# Patient Record
Sex: Male | Born: 1975 | Race: Black or African American | Hispanic: No | Marital: Single | Smoking: Current every day smoker
Health system: Southern US, Community
[De-identification: ages and names within clinical notes are randomized; demographics above are authoritative.]

## PROBLEM LIST (undated history)

## (undated) DIAGNOSIS — I7300 Raynaud's syndrome without gangrene: Secondary | ICD-10-CM

## (undated) DIAGNOSIS — M1990 Unspecified osteoarthritis, unspecified site: Secondary | ICD-10-CM

## (undated) DIAGNOSIS — T7840XA Allergy, unspecified, initial encounter: Secondary | ICD-10-CM

## (undated) DIAGNOSIS — R011 Cardiac murmur, unspecified: Secondary | ICD-10-CM

## (undated) DIAGNOSIS — R21 Rash and other nonspecific skin eruption: Secondary | ICD-10-CM

## (undated) HISTORY — PX: TONSILECTOMY, ADENOIDECTOMY, BILATERAL MYRINGOTOMY AND TUBES: SHX2538

## (undated) HISTORY — DX: Cardiac murmur, unspecified: R01.1

## (undated) HISTORY — PX: TONSILLECTOMY: SUR1361

## (undated) HISTORY — DX: Rash and other nonspecific skin eruption: R21

## (undated) HISTORY — DX: Allergy, unspecified, initial encounter: T78.40XA

## (undated) HISTORY — DX: Unspecified osteoarthritis, unspecified site: M19.90

---

## 2007-11-29 ENCOUNTER — Emergency Department: Payer: Self-pay | Admitting: Emergency Medicine

## 2008-04-10 ENCOUNTER — Emergency Department: Payer: Self-pay | Admitting: Unknown Physician Specialty

## 2008-11-08 ENCOUNTER — Ambulatory Visit: Payer: Self-pay | Admitting: Family Medicine

## 2016-03-28 ENCOUNTER — Emergency Department
Admission: EM | Admit: 2016-03-28 | Discharge: 2016-03-28 | Disposition: A | Discharge status: Home / Self Care | Payer: Managed Care, Other (non HMO) | Attending: Emergency Medicine | Admitting: Emergency Medicine

## 2016-03-28 ENCOUNTER — Emergency Department: Payer: Managed Care, Other (non HMO)

## 2016-03-28 ENCOUNTER — Encounter: Payer: Self-pay | Admitting: *Deleted

## 2016-03-28 DIAGNOSIS — R42 Dizziness and giddiness: Secondary | ICD-10-CM | POA: Diagnosis present

## 2016-03-28 DIAGNOSIS — F17200 Nicotine dependence, unspecified, uncomplicated: Secondary | ICD-10-CM | POA: Diagnosis not present

## 2016-03-28 LAB — URINALYSIS COMPLETE WITH MICROSCOPIC (ARMC ONLY)
Bacteria, UA: NONE SEEN
Bilirubin Urine: NEGATIVE
Glucose, UA: NEGATIVE mg/dL
HGB URINE DIPSTICK: NEGATIVE
LEUKOCYTES UA: NEGATIVE
NITRITE: NEGATIVE
PROTEIN: NEGATIVE mg/dL
SPECIFIC GRAVITY, URINE: 1.016 (ref 1.005–1.030)
pH: 5 (ref 5.0–8.0)

## 2016-03-28 LAB — CBC
HEMATOCRIT: 46.6 % (ref 40.0–52.0)
HEMOGLOBIN: 15.5 g/dL (ref 13.0–18.0)
MCH: 30.1 pg (ref 26.0–34.0)
MCHC: 33.3 g/dL (ref 32.0–36.0)
MCV: 90.4 fL (ref 80.0–100.0)
Platelets: 205 10*3/uL (ref 150–440)
RBC: 5.16 MIL/uL (ref 4.40–5.90)
RDW: 14 % (ref 11.5–14.5)
WBC: 6.5 10*3/uL (ref 3.8–10.6)

## 2016-03-28 LAB — BASIC METABOLIC PANEL
ANION GAP: 6 (ref 5–15)
BUN: 13 mg/dL (ref 6–20)
CALCIUM: 9.2 mg/dL (ref 8.9–10.3)
CO2: 28 mmol/L (ref 22–32)
Chloride: 107 mmol/L (ref 101–111)
Creatinine, Ser: 1.02 mg/dL (ref 0.61–1.24)
Glucose, Bld: 87 mg/dL (ref 65–99)
POTASSIUM: 3.7 mmol/L (ref 3.5–5.1)
SODIUM: 141 mmol/L (ref 135–145)

## 2016-03-28 LAB — TROPONIN I

## 2016-03-28 MED ORDER — MECLIZINE HCL 25 MG PO TABS
50.0000 mg | ORAL_TABLET | Freq: Once | ORAL | Status: AC
  Administered 2016-03-28: 50 mg via ORAL
  Filled 2016-03-28: qty 2

## 2016-03-28 MED ORDER — MECLIZINE HCL 25 MG PO TABS: 25.0000 mg | ORAL_TABLET | Freq: Three times a day (TID) | ORAL | 0 refills | Status: DC | PRN

## 2016-03-28 NOTE — ED Notes (Signed)
Pt stating that it "feels like I'm spinning." Pt stating that his dizziness is increased with looking up or down. Pt stating that his vision is blurry, which is new for the pt. Pt stating that he has had neck pain for months and has seen a chiropractor. Pt stating some right sided CP . Pt stating that he has had N/V also. Last emesis was about 30 minutes. Pt stating that he believes the nausea is from the dizziness. Pt in NAD at this time.

## 2016-03-28 NOTE — ED Provider Notes (Signed)
[facility] Emergency Department Provider Note  Time seen: 10:22 PM  I have reviewed the triage vital signs and the nursing notes.   HISTORY  Chief Complaint Dizziness    HPI Rodney Higgins is a 40 y.o. male with no past medical history who presents the emergency department for dizziness. According to the patient since around 6:00 this morning he has been intermittently feeling very dizzy. He states his symptoms are worse when he is up walking around. He describes the dizziness as feeling very off balance and lightheaded. Denies any spinning sensation. He states his symptoms are worse when he moves his head. Denies any recent illness. He does state for the past one month he has had somewhat of a cough and for the past several days he has had intermittent chest pain that he relates this to the cough. Denies any focal weakness or numbness. Denies any confusion or slurred speech. Denies any symptoms of dizziness currently.  No past medical history on file.  There are no active problems to display for this patient.   No past surgical history on file.  Prior to Admission medications   Not on File    Allergies  Allergen Reactions  . Penicillins Hives    No family history on file.  Social History Social History  Substance Use Topics  . Smoking status: Current Every Day Smoker  . Smokeless tobacco: Never Used  . Alcohol use No    Review of Systems Constitutional: Negative for fever. Cardiovascular: Negative for chest pain. Respiratory: Negative for shortness of breath. Gastrointestinal: Negative for abdominal pain Genitourinary: Negative for dysuria. Neurological: Negative for headache 10-point ROS otherwise negative.  ____________________________________________   PHYSICAL EXAM:  VITAL SIGNS: ED Triage Vitals  Enc Vitals Group     BP 03/28/16 1917 115/69     Pulse Rate 03/28/16 1917 64     Resp 03/28/16 1917 20     Temp 03/28/16 1917  98.1 F (36.7 C)     Temp Source 03/28/16 1917 Oral     SpO2 03/28/16 1917 99 %     Weight 03/28/16 1918 155 lb (70.3 kg)     Height 03/28/16 1918 5\' 7"  (1.702 m)     Head Circumference --      Peak Flow --      Pain Score --      Pain Loc --      Pain Edu? --      Excl. in GC? --     Constitutional: Alert and oriented. Well appearing and in no distress. Eyes: Normal exam ENT   Head: Normocephalic and atraumatic   Mouth/Throat: Mucous membranes are moist. Cardiovascular: Normal rate, regular rhythm. No murmur Respiratory: Normal respiratory effort without tachypnea nor retractions. Breath sounds are clear  Gastrointestinal: Soft and nontender. No distention. Musculoskeletal: Nontender with normal range of motion in all extremities.  Neurologic:  Normal speech and language. No gross focal neurologic deficits. Equal grip strengths. 5/5 motor in all extremities. No pronator drift. Cranial nerves intact. Skin:  Skin is warm, dry and intact.  Psychiatric: Mood and affect are normal.   ____________________________________________    EKG  EKG reviewed and interpreted by myself shows normal sinus rhythm at 65 bpm, narrow QRS, normal axis, normal intervals, no concerning ST changes. Overall normal/reassuring EKG.  ____________________________________________    RADIOLOGY  CT head is negative  ____________________________________________   INITIAL IMPRESSION / ASSESSMENT AND PLAN / ED COURSE  Pertinent labs & imaging results  that were available during my care of the patient were reviewed by me and considered in my medical decision making (see chart for details).  The patient presents the emergency department with intermittent dizziness throughout the day today. Patient has a normal neurologic exam. Normal physical exam. Overall appears very well. Patient's lab work is largely within normal limits including negative troponin. We will dose meclizine and proceed with a CT  scan of the patient's head to further evaluate. The patient is agreeable to this plan.  CT head is negative. Patient's labs within normal limits. Patient states he is feeling much better after meclizine. We'll discharge the meclizine and PCP follow-up.  ____________________________________________   FINAL CLINICAL IMPRESSION(S) / ED DIAGNOSES  Dizziness    Minna AntisKevin Kavish Lafitte, MD 03/28/16 [phone]

## 2016-03-28 NOTE — ED Triage Notes (Addendum)
Pt to triage via wheelchair.  Pt states he woke up dizzy this morning.  Pt states he has neck pain.  Intermittent visual problems.  Pt reports nausea.  Pt alert.  Speech clear.  Pt denies headache.  Pt states room is spinning at times.  No hx vertigo.

## 2018-02-11 ENCOUNTER — Other Ambulatory Visit: Payer: Self-pay

## 2018-02-11 ENCOUNTER — Ambulatory Visit: Payer: Commercial Managed Care - PPO | Admitting: Urology

## 2018-02-11 ENCOUNTER — Encounter: Payer: Self-pay | Admitting: Urology

## 2018-02-11 VITALS — BP 128/80 | HR 88 | Ht 68.0 in | Wt 161.4 lb

## 2018-02-11 DIAGNOSIS — Z8042 Family history of malignant neoplasm of prostate: Secondary | ICD-10-CM

## 2018-02-11 DIAGNOSIS — Z3009 Encounter for other general counseling and advice on contraception: Secondary | ICD-10-CM

## 2018-02-11 MED ORDER — DIAZEPAM 10 MG PO TABS: 10.0000 mg | ORAL_TABLET | Freq: Once | ORAL | 0 refills | Status: AC

## 2018-02-11 NOTE — Patient Instructions (Signed)
Vasectomy  A vasectomy is tying (with or without cutting) the tube that collects the sperm from the testicle (vas deferens). The vasectomy blocks the sperm from going through the vas deferens and penis so that during sexual intercourse, the sperm does not go into the vagina. Vasectomy is safe, with very rare complications. It does not affect your sexual desire or performance. A vasectomy does not prevent sexually transmitted diseases.  Because vasectomy is considered permanent, you should not have it done until you are sure you do not want any more children. You and your partner should be in full agreement to have the procedure. Your decision to have a vasectomy should not be made during a stressful situation. This includes loss of a pregnancy, illness, death of a spouse, or divorce. There are other means of contraception that can be used until you are completely sure you want this procedure done.  Tell a health care provider about:  · Any allergies you have.  · All medicines you are taking, including vitamins, herbs, eye drops, creams, and over-the-counter medicines.  · Any problems you or family members have had with anesthetic medicines.  · Any blood disorders you have.  · Any surgeries you have had.  · Any medical conditions you have.  What are the risks?  Generally, vasectomy is a safe procedure. However, as with any procedure, complications can occur. Possible complications include:  · Failure of the procedure to cause infertility. This means you would still be able to get a male pregnant. Even after sterilization has been achieved, there is a 1 in 10,000 chance that the two cut ends may reconnect (recanalization).  · Infection. A germ starts growing in the wound. This can usually be treated with antibiotic medicine(s).  · An allergic reaction to the anesthetic or other medicine given.  · Bleeding. Blood may seep under the skin so that the scrotum and penis  appear to be bruised. Sometimes the scrotum can swell and get the size of a grapefruit. This usually disappears without treatment within a week or two.    What happens before the procedure?  · Do not take aspirin or aspirin-containing products for 7 days prior to your procedure.  · Do not take nonsteroidal anti-inflammatory products for 7 days prior to your procedure.  · You may be instructed to wash with soap before coming in for your procedure.  What happens during the procedure?  · The scrotum is cleaned with bacteria-killing soap, and the health care provider finds the vas deferens.  · Each side of the scrotum is numbed.  · A very small cut (incision) is made, and the vas deferens are pulled out of the scrotum. The vas deferens are then tied off, cut, or may be burned (cauterized) at the ends.  · Sometimes the vas deferens are pulled out from the scrotum through a puncture wound. This is done with a special instrument without an incision.  · The vas deferens are then put back into the scrotum, and the incision or puncture wound is closed. Absorbable suture material that will dissolve and not need to be removed is commonly used.  · After surgery, sperm may still be left in the vas deferens for 1–3 months. Because of this, other means of contraception should be used until your health care provider examines you and finds there are no sperm in your seminal fluid.  What happens after the procedure?  After the procedure, you will be taken to the recovery area. Your   progress will be watched and checked. Once you are awake, stable, and taking fluids well, you will be allowed to go home as long as there are no problems.  This information is not intended to replace advice given to you by your health care provider. Make sure you discuss any questions you have with your health care provider.  Document Released: 09/07/2002 Document Revised: 11/23/2015 Document Reviewed: 01/04/2013   Elsevier Interactive Patient Education © 2018 [employer]

## 2018-02-11 NOTE — Progress Notes (Signed)
   02/11/2018 10:04 AM   Rodney Higgins Nov 27, 1975 [id number]  Referring provider: [facility] [address] [place], [place] [address]  CC: Desire for sterilization  HPI: I am seeing Rodney Higgins in urology clinic today for vasectomy consultation.  He is a healthy 42 year old man who is married with one 43 year old girl.  Both he and his wife do not desire any further children.  His wife did have an ectopic pregnancy health scare recently and they would like to pursue surgical sterilization.  He denies any prior urologic problems.  They are currently using the pullout method for contraception.  He does report a history of lethal prostate cancer in his family.   PMH: History reviewed. No pertinent past medical history.  Surgical History: History reviewed. No pertinent surgical history.  Allergies:  Allergies  Allergen Reactions  . Penicillins Hives    Family History: Family History  Problem Relation Age of Onset  . Lung cancer Father 365  . Prostate cancer Paternal Grandfather     Social History:  reports that he has been smoking. He has never used smokeless tobacco. He reports that he drinks about 2.0 standard drinks of alcohol per week. He reports that he does not use drugs.  ROS: Please see flowsheet from today's date for complete review of systems.  Physical Exam: BP 128/80   Pulse 88   Ht 5\' 8"  (1.727 m)   Wt 161 lb 6.4 oz (73.2 kg)   BMI 24.54 kg/m    Constitutional:  Alert and oriented, No acute distress. Cardiovascular: No clubbing, cyanosis, or edema. Respiratory: Normal respiratory effort, no increased work of breathing. GI: Abdomen is soft, nontender, nondistended, no abdominal masses GU: No CVA tenderness, circumcised phallus without lesions, widely patent meatus, thick scrotum, however vas are palpable bilaterally Lymph: No cervical or inguinal lymphadenopathy. Skin: No rashes, bruises or suspicious  lesions. Neurologic: Grossly intact, no focal deficits, moving all 4 extremities. Psychiatric: Normal mood and affect.  Laboratory Data: None  Pertinent Imaging: None to review  Assessment & Plan:   In summary, Rodney Higgins is a healthy 42 year old male with a family history of lethal prostate cancer here for vasectomy evaluation.  We had a long discussion about surgical sterilization with vasectomy today.  We discussed the risks and benefits of the procedure in detail.  We specifically discussed that it should be considered a permanent form of sterilization, and alternatives for pregnancy in the future (vas reversal and IVF/ICSI) are both expensive and not guaranteed to work.  We discussed the 1 to 2% risk of bleeding, infection, and chronic testicular pain.  We discussed the 07/1998 failure rate when sterility confirmed with a negative 8 to 16-week postvasectomy semen analysis, and the important of continuing contraception until that time.    -We will schedule for vasectomy next available pending insurance approval -Start PSA screening  Sondra ComeBrian C Naaman Curro, MD  [facility] [address], [address] [place], [place] [address] [phone]

## 2018-02-20 ENCOUNTER — Encounter: Payer: Commercial Managed Care - PPO | Admitting: Urology

## 2018-03-27 ENCOUNTER — Ambulatory Visit: Payer: Commercial Managed Care - PPO | Admitting: Urology

## 2018-03-27 ENCOUNTER — Encounter: Payer: Self-pay | Admitting: Urology

## 2018-03-27 VITALS — BP 101/69 | HR 82 | Resp 16 | Ht 68.0 in | Wt 166.4 lb

## 2018-03-27 DIAGNOSIS — Z302 Encounter for sterilization: Secondary | ICD-10-CM | POA: Diagnosis not present

## 2018-03-27 NOTE — Progress Notes (Signed)
VASECTOMY PROCEDURE NOTE:  The patient was taken to the minor procedure room and placed in the supine position. His genitals were prepped and draped in the usual sterile fashion. The left vas deferens was brought up to the skin of the left upper scrotum. The skin overlying it was anesthetized with 1% lidocaine without epinephrine, anesthetic was also injected proximally alongside the vas deferens. The no scalpel vasectomy instrument was used to make a small perforation in the scrotal skin. The vasectomy clamp was used to grasp the vas deferens. It was carefully dissected free from surrounding structures. A 1cm segment of the vas was removed, and the cut ends of the mucosa were cauterized. A 4-0 chromic was used to turn back the proximal end of the vas. No significant bleeding was noted. The vas deferens was returned to the scrotum. The skin incision was closed with a simple interrupted stitch of 4-0 chromic.  Attention was then turned to the right side. The right vasectomy was performed in the same exact fashion. Sterile dressings were placed over each incision. The patient tolerated the procedure well.  IMPRESSION/DIAGNOSIS: The patient is a 42 year old gentleman who underwent a vasectomy today. Post-procedure instructions were reviewed. I stressed the importance of continuing to use birth control until he provides a semen specimen more than 2 months from now that demonstrates azoospermia.  Counseled regarding return precautions including fever over 101, significant bleeding or hematoma, or uncontrolled pain. Stressed the importance of avoiding strenuous activity for one week, no sexual activity for 5 days, intermittent icing over the next 48 hours, and scrotal support.   PLAN: The patient will be advised of his semen analysis results when available.  Legrand Rams, MD 03/27/2018

## 2018-06-18 ENCOUNTER — Other Ambulatory Visit: Payer: Self-pay

## 2018-06-18 DIAGNOSIS — Z302 Encounter for sterilization: Secondary | ICD-10-CM

## 2018-06-19 ENCOUNTER — Other Ambulatory Visit: Payer: Commercial Managed Care - PPO

## 2018-06-25 ENCOUNTER — Other Ambulatory Visit: Payer: Commercial Managed Care - PPO

## 2018-06-25 ENCOUNTER — Other Ambulatory Visit: Payer: Self-pay

## 2018-06-25 DIAGNOSIS — Z9852 Vasectomy status: Secondary | ICD-10-CM

## 2018-06-25 NOTE — Progress Notes (Signed)
Patient dropped off semen post vas check today but the sample provided was not enough volume to be sent out for analysis. Patient was contacted and instructed to recollect, he will stop by the office to pick up another cup and repeat drop off

## 2019-06-14 ENCOUNTER — Other Ambulatory Visit: Payer: Self-pay | Admitting: Urology

## 2019-06-14 DIAGNOSIS — Z302 Encounter for sterilization: Secondary | ICD-10-CM

## 2019-09-15 ENCOUNTER — Ambulatory Visit: Payer: Commercial Managed Care - PPO | Admitting: Nurse Practitioner

## 2019-10-19 ENCOUNTER — Other Ambulatory Visit: Payer: Self-pay

## 2019-10-19 ENCOUNTER — Ambulatory Visit: Payer: Commercial Managed Care - PPO | Admitting: Nurse Practitioner

## 2019-10-19 ENCOUNTER — Encounter: Payer: Self-pay | Admitting: Nurse Practitioner

## 2019-10-19 VITALS — BP 123/83 | HR 83 | Temp 98.2°F | Ht 67.0 in | Wt 165.0 lb

## 2019-10-19 DIAGNOSIS — Z7689 Persons encountering health services in other specified circumstances: Secondary | ICD-10-CM | POA: Diagnosis not present

## 2019-10-19 DIAGNOSIS — B029 Zoster without complications: Secondary | ICD-10-CM | POA: Diagnosis not present

## 2019-10-19 DIAGNOSIS — R011 Cardiac murmur, unspecified: Secondary | ICD-10-CM | POA: Diagnosis not present

## 2019-10-19 DIAGNOSIS — I7300 Raynaud's syndrome without gangrene: Secondary | ICD-10-CM | POA: Diagnosis not present

## 2019-10-19 DIAGNOSIS — F17210 Nicotine dependence, cigarettes, uncomplicated: Secondary | ICD-10-CM

## 2019-10-19 MED ORDER — VALACYCLOVIR HCL 1 G PO TABS: 1000.0000 mg | ORAL_TABLET | Freq: Three times a day (TID) | ORAL | 0 refills | Status: AC

## 2019-10-19 MED ORDER — AMLODIPINE BESYLATE 5 MG PO TABS: 5.0000 mg | ORAL_TABLET | Freq: Every day | ORAL | 3 refills | Status: DC

## 2019-10-19 NOTE — Patient Instructions (Signed)
Raynaud Phenomenon ° °Raynaud phenomenon is a condition that affects the blood vessels (arteries) that carry blood to your fingers and toes. The arteries that supply blood to your ears, lips, nipples, or the tip of your nose might also be affected. Raynaud phenomenon causes the arteries to become narrow temporarily (spasm). As a result, the flow of blood to the affected areas is temporarily decreased. This usually occurs in response to cold temperatures or stress. During an attack, the skin in the affected areas turns white, then blue, and finally red. You may also feel tingling or numbness in those areas. °Attacks usually last for only a brief period, and then the blood flow to the area returns to normal. In most cases, Raynaud phenomenon does not cause serious health problems. °What are the causes? °In many cases, the cause of this condition is not known. The condition may occur on its own (primary Raynaud phenomenon) or may be associated with other diseases or factors (secondary Raynaud phenomenon). °Possible causes may include: °· Diseases or medical conditions that damage the arteries. °· Injuries and repetitive actions that hurt the hands or feet. °· Being exposed to certain chemicals. °· Taking medicines that narrow the arteries. °· Other medical conditions, such as lupus, scleroderma, rheumatoid arthritis, thyroid problems, blood disorders, Sjogren syndrome, or atherosclerosis. °What increases the risk? °The following factors may make you more likely to develop this condition: °· Being 20-40 years old. °· Being male. °· Having a family history of Raynaud phenomenon. °· Living in a cold climate. °· Smoking. °What are the signs or symptoms? °Symptoms of this condition usually occur when you are exposed to cold temperatures or when you have emotional stress. The symptoms may last for a few minutes or up to several hours. They usually affect your fingers but may also affect your toes, nipples, lips, ears, or  the tip of your nose. Symptoms may include: °· Changes in skin color. The skin in the affected areas will turn pale or white. The skin may then change from white to bluish to red as normal blood flow returns to the area. °· Numbness, tingling, or pain in the affected areas. °In severe cases, symptoms may include: °· Skin sores. °· Tissues decaying and dying (gangrene). °How is this diagnosed? °This condition may be diagnosed based on: °· Your symptoms and medical history. °· A physical exam. During the exam, you may be asked to put your hands in cold water to check for a reaction to cold temperature. °· Tests, such as: °? Blood tests to check for other diseases or conditions. °? A test to check the movement of blood through your arteries and veins (vascular ultrasound). °? A test in which the skin at the base of your fingernail is examined under a microscope (nailfold capillaroscopy). °How is this treated? °Treatment for this condition often involves making lifestyle changes and taking steps to control your exposure to cold temperatures. For more severe cases, medicine (calcium channel blockers) may be used to improve blood flow. Surgery is sometimes done to block the nerves that control the affected arteries, but this is rare. °Follow these instructions at home: °Avoiding cold temperatures °Take these steps to avoid exposure to cold: °· If possible, stay indoors during cold weather. °· When you go outside during cold weather, dress in layers and wear mittens, a hat, a scarf, and warm footwear. °· Wear mittens or gloves when handling ice or frozen food. °· Use holders for glasses or cans containing cold drinks. °·   Let warm water run for a while before taking a shower or bath. °· Warm up the car before driving in cold weather. °Lifestyle ° °· If possible, avoid stressful and emotional situations. Try to find ways to manage your stress, such as: °? Exercise. °? Yoga. °? Meditation. °? Biofeedback. °· Do not use any  products that contain nicotine or tobacco, such as cigarettes and e-cigarettes. If you need help quitting, ask your health care provider. °· Avoid secondhand smoke. °· Limit your use of caffeine. °? Switch to decaffeinated coffee, tea, and soda. °? Avoid chocolate. °· Avoid vibrating tools and machinery. °General instructions °· Protect your hands and feet from injuries, cuts, or bruises. °· Avoid wearing tight rings or wristbands. °· Wear loose fitting socks and comfortable, roomy shoes. °· Take over-the-counter and prescription medicines only as told by your health care provider. °Contact a health care provider if: °· Your discomfort becomes worse despite lifestyle changes. °· You develop sores on your fingers or toes that do not heal. °· Your fingers or toes turn black. °· You have breaks in the skin on your fingers or toes. °· You have a fever. °· You have pain or swelling in your joints. °· You have a rash. °· Your symptoms occur on only one side of your body. °Summary °· Raynaud phenomenon is a condition that affects the arteries that carry blood to your fingers, toes, ears, lips, nipples, or the tip of your nose. °· In many cases, the cause of this condition is not known. °· Symptoms of this condition include changes in skin color, and numbness and tingling of the affected area. °· Treatment for this condition includes lifestyle changes, reducing exposure to cold temperatures, and using medicines for severe cases of the condition. °· Contact your health care provider if your condition worsens despite treatment. °This information is not intended to replace advice given to you by your health care provider. Make sure you discuss any questions you have with your health care provider. °Document Revised: 06/20/2017 Document Reviewed: 07/29/2016 °Elsevier Patient Education © 2020 [employer]. ° °

## 2019-10-19 NOTE — Progress Notes (Signed)
New Patient Office Visit  Subjective:  Patient ID: Rodney Higgins, male    DOB: 11-06-1975  Age: 44 y.o. MRN: [id number]  CC:  Chief Complaint  Patient presents with  . Establish Care  . Rash    back of the head x over two weeks now. went to UC. given cetirizine and kenalog cream. pt stetes meds are helping but not as he would like to.    HPI Rodney Higgins presents for new patient visit to establish care.  Introduced to Designer, jewellery role and practice setting.  All questions answered.  Patient is a smoker, started at age 44 to 53, currently smokes 3-4 cigarettes a day.  He reports trying to quit twice a week and then goes to work and gets irritated, starts back.  At this time wishes to continues quitting attempts on his own.  RASH Presented over 2 weeks ago, reports UC visit and was treated for shingles with Valtrex and cream.  Reports he does shave his head.  Area did initially improve and is not as bad as it was, but reports ongoing rash to back of scalp which he felt was agitated from shaving head.  Continues to use Triamcinolone cream to area which helps.  No further pain reported, this has improved. Duration:  days  Location: posterior scalp  Itching: no Burning: no Redness: yes Oozing: no Scaling: no Blisters: no Painful: no Fevers: no Change in detergents/soaps/personal care products: no Recent illness: no Recent travel:no History of same: yes Context: fluctuating Alleviating factors: Triamcinolone Treatments attempted:Triamcinolone Shortness of breath: no  Throat/tongue swelling: no Myalgias/arthralgias: no   HAND PAIN: Has been going on 2-3 years, was assessed by previous clinic who told him it was "just inflammation" and arthritis.  He reports they get white like a shirt and cold, happens once to twice a week.  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  Is able to move and function, no  decrease ROM.  Has more flares when cold outside then when warm.  Reports pain is like a burning when his fingers and top palm turn white, but this goes away over time (15 minutes).  Is not constant. Duration: years Involved hand: bilateral Mechanism of injury: none Location: diffuse Onset: gradual Severity: mild  Quality: burning Frequency: intermittent Radiation: no Aggravating factors: cold temps Alleviating factors: warmth Treatments attempted: none Relief with NSAIDs?: No NSAIDs Taken Weakness: no Numbness: no Redness: no Swelling:no Bruising: no Fevers: no  Past Medical History:  Diagnosis Date  . Allergy   . Arthritis   . Heart murmur    diagnosed in 5th to 6th grade -- no echo -- has had EKG  . Rash     Past Surgical History:  Procedure Laterality Date  . TONSILECTOMY, ADENOIDECTOMY, BILATERAL MYRINGOTOMY AND TUBES      Family History  Problem Relation Age of Onset  . Hypertension Mother   . Lung cancer Father 13  . Prostate cancer Paternal Grandfather   . Heart attack Paternal Grandfather   . Diabetes Maternal Uncle   . Cancer Paternal Uncle   . Stroke Maternal Grandmother   . Heart attack Maternal Grandmother   . Cancer Maternal Grandfather     Social History   Socioeconomic History  . Marital status: Single    Spouse name: Not on file  . Number of children: Not on file  . Years of  education: Not on file  . Highest education level: Not on file  Occupational History  . Not on file  Tobacco Use  . Smoking status: Current Every Day Smoker    Packs/day: 0.25    Types: Cigarettes  . Smokeless tobacco: Never Used  Substance and Sexual Activity  . Alcohol use: Yes    Alcohol/week: 11.0 standard drinks    Types: 2 Glasses of wine, 6 Cans of beer, 3 Shots of liquor per week    Comment: does not drink Mon through Friday -- only weekends  . Drug use: Not Currently    Types: Marijuana  . Sexual activity: Yes  Other Topics Concern  . Not on file    Social History Narrative  . Not on file   Social Determinants of Health   Financial Resource Strain: Low Risk   . Difficulty of Paying Living Expenses: Not hard at all  Food Insecurity: No Food Insecurity  . Worried About Programme researcher, broadcasting/film/video in the Last Year: Never true  . Ran Out of Food in the Last Year: Never true  Transportation Needs: No Transportation Needs  . Lack of Transportation (Medical): No  . Lack of Transportation (Non-Medical): No  Physical Activity: Insufficiently Active  . Days of Exercise per Week: 3 days  . Minutes of Exercise per Session: 30 min  Stress: No Stress Concern Present  . Feeling of Stress : Only a little  Social Connections: Unknown  . Frequency of Communication with Friends and Family: Three times a week  . Frequency of Social Gatherings with Friends and Family: Three times a week  . Attends Religious Services: Never  . Active Member of Clubs or Organizations: No  . Attends Banker Meetings: Never  . Marital Status: Not on file  Intimate Partner Violence:   . Fear of Current or Ex-Partner:   . Emotionally Abused:   Marland Kitchen Physically Abused:   . Sexually Abused:     ROS Review of Systems  Constitutional: Negative for activity change, diaphoresis, fatigue and fever.  Respiratory: Negative for cough, chest tightness, shortness of breath and wheezing.   Cardiovascular: Negative for chest pain, palpitations and leg swelling.  Gastrointestinal: Negative.   Musculoskeletal: Positive for arthralgias.  Skin: Positive for rash.  Neurological: Negative.   Psychiatric/Behavioral: Negative.     Objective:   Today's Vitals: BP 123/83   Pulse 83   Temp 98.2 F (36.8 C) (Oral)   Ht 5\' 7"  (1.702 m)   Wt 165 lb (74.8 kg)   SpO2 98%   BMI 25.84 kg/m   Physical Exam Vitals and nursing note reviewed.  Constitutional:      General: He is awake. He is not in acute distress.    Appearance: He is well-developed and well-groomed. He is not  ill-appearing.  HENT:     Head: Normocephalic and atraumatic.     Right Ear: Hearing normal. No drainage.     Left Ear: Hearing normal. No drainage.  Eyes:     General: Lids are normal.        Right eye: No discharge.        Left eye: No discharge.     Conjunctiva/sclera: Conjunctivae normal.     Pupils: Pupils are equal, round, and reactive to light.  Neck:     Thyroid: No thyromegaly.     Vascular: No carotid bruit.  Cardiovascular:     Rate and Rhythm: Normal rate and regular rhythm.     Heart  sounds: S1 normal and S2 normal. Murmur present. Systolic murmur present with a grade of 1/6. No gallop.   Pulmonary:     Effort: Pulmonary effort is normal. No accessory muscle usage or respiratory distress.     Breath sounds: Normal breath sounds.  Abdominal:     General: Bowel sounds are normal.     Palpations: Abdomen is soft.  Musculoskeletal:        General: Normal range of motion.     Right hand: Normal.     Left hand: Normal.     Cervical back: Normal range of motion and neck supple.     Right lower leg: No edema.     Left lower leg: No edema.     Comments: Unable to reproduce discomfort or color change on exam today.  Skin:    General: Skin is warm and dry.     Capillary Refill: Capillary refill takes less than 2 seconds.     Findings: Rash present.       Neurological:     Mental Status: He is alert and oriented to person, place, and time.  Psychiatric:        Attention and Perception: Attention normal.        Mood and Affect: Mood normal.        Speech: Speech normal.        Behavior: Behavior normal. Behavior is cooperative.        Thought Content: Thought content normal.     Assessment & Plan:   Problem List Items Addressed This Visit      Other   Shingles rash    Ongoing rash, crusting present.  Have recommend he avoid shaving scalp until rash has completely healed.  Will repeat Valtrex treatment x 1 -- script sent for 1000 MG TID x 7 days.  Continue to use  Triamcinolone cream as needed for comfort.  Ensure to wash all linens in bed.  Return to office in 4 weeks OR sooner if any worsening of symptoms presents.      Relevant Medications   valACYclovir (VALTREX) 1000 MG tablet   Raynaud phenomenon    Hand pain symptoms highly suspicious for Raynaud.  Have recommended complete smoking cessation.  Will trial Amlodipine 5 MG daily to see if it offers benefit and decreases amount of flares. Educated him on this medication + side effects to monitor for and report.  Avoid cold exposure when possible.  Wear hand warmers if unable to avoid cold exposure and using hands.  Recommend use of warm gloves at work.  Return to office in 4 weeks for physical exam and labs.      Nicotine dependence, cigarettes, uncomplicated    I have recommended complete cessation of tobacco use. I have discussed various options available for assistance with tobacco cessation including over the counter methods (Nicotine gum, patch and lozenges). We also discussed prescription options (Chantix, Nicotine Inhaler / Nasal Spray). The patient is not interested in pursuing any prescription tobacco cessation options at this time.       Heart murmur    Ongoing for years, no previous echo and asymptomatic.  Could consider echo if any symptoms present or worsening murmur.         Other Visit Diagnoses    Encounter to establish care    -  Primary      Outpatient Encounter Medications as of 10/19/2019  Medication Sig  . cetirizine (ZYRTEC) 10 MG tablet Take 10 mg by mouth  daily.  . triamcinolone cream (KENALOG) 0.1 % APPLY A THIN LAYER TO AFFECTED AREAS TWICE DAILY AS NEEDED  . amLODipine (NORVASC) 5 MG tablet Take 1 tablet (5 mg total) by mouth daily.  . valACYclovir (VALTREX) 1000 MG tablet Take 1 tablet (1,000 mg total) by mouth 3 (three) times daily for 7 days.  . [DISCONTINUED] meclizine (ANTIVERT) 25 MG tablet Take 1 tablet (25 mg total) by mouth 3 (three) times daily as needed for  dizziness.  . [DISCONTINUED] meloxicam (MOBIC) 15 MG tablet    No facility-administered encounter medications on file as of 10/19/2019.    Follow-up: Return in about 4 weeks (around 11/16/2019) for Annual physical.   Marjie Skiff, NP

## 2019-10-19 NOTE — Assessment & Plan Note (Signed)
Ongoing rash, crusting present.  Have recommend he avoid shaving scalp until rash has completely healed.  Will repeat Valtrex treatment x 1 -- script sent for 1000 MG TID x 7 days.  Continue to use Triamcinolone cream as needed for comfort.  Ensure to wash all linens in bed.  Return to office in 4 weeks OR sooner if any worsening of symptoms presents.

## 2019-10-19 NOTE — Assessment & Plan Note (Signed)
Ongoing for years, no previous echo and asymptomatic.  Could consider echo if any symptoms present or worsening murmur.   

## 2019-10-19 NOTE — Assessment & Plan Note (Signed)
I have recommended complete cessation of tobacco use. I have discussed various options available for assistance with tobacco cessation including over the counter methods (Nicotine gum, patch and lozenges). We also discussed prescription options (Chantix, Nicotine Inhaler / Nasal Spray). The patient is not interested in pursuing any prescription tobacco cessation options at this time.  

## 2019-10-19 NOTE — Assessment & Plan Note (Addendum)
Hand pain symptoms highly suspicious for Raynaud.  Have recommended complete smoking cessation.  Will trial Amlodipine 5 MG daily to see if it offers benefit and decreases amount of flares. Educated him on this medication + side effects to monitor for and report.  Avoid cold exposure when possible.  Wear hand warmers if unable to avoid cold exposure and using hands.  Recommend use of warm gloves at work.  Return to office in 4 weeks for physical exam and labs.

## 2019-11-16 ENCOUNTER — Ambulatory Visit (INDEPENDENT_AMBULATORY_CARE_PROVIDER_SITE_OTHER): Payer: Commercial Managed Care - PPO | Admitting: Nurse Practitioner

## 2019-11-16 ENCOUNTER — Encounter: Payer: Self-pay | Admitting: Nurse Practitioner

## 2019-11-16 ENCOUNTER — Other Ambulatory Visit: Payer: Self-pay

## 2019-11-16 VITALS — BP 103/70 | HR 85 | Temp 98.4°F | Ht 66.81 in | Wt 162.5 lb

## 2019-11-16 DIAGNOSIS — L03011 Cellulitis of right finger: Secondary | ICD-10-CM

## 2019-11-16 DIAGNOSIS — M1712 Unilateral primary osteoarthritis, left knee: Secondary | ICD-10-CM | POA: Diagnosis not present

## 2019-11-16 DIAGNOSIS — Z0000 Encounter for general adult medical examination without abnormal findings: Secondary | ICD-10-CM | POA: Diagnosis not present

## 2019-11-16 DIAGNOSIS — Z13220 Encounter for screening for lipoid disorders: Secondary | ICD-10-CM

## 2019-11-16 DIAGNOSIS — Z23 Encounter for immunization: Secondary | ICD-10-CM

## 2019-11-16 DIAGNOSIS — Z800 Family history of malignant neoplasm of digestive organs: Secondary | ICD-10-CM

## 2019-11-16 DIAGNOSIS — I7300 Raynaud's syndrome without gangrene: Secondary | ICD-10-CM | POA: Diagnosis not present

## 2019-11-16 DIAGNOSIS — Z8042 Family history of malignant neoplasm of prostate: Secondary | ICD-10-CM

## 2019-11-16 DIAGNOSIS — Z113 Encounter for screening for infections with a predominantly sexual mode of transmission: Secondary | ICD-10-CM

## 2019-11-16 DIAGNOSIS — F17210 Nicotine dependence, cigarettes, uncomplicated: Secondary | ICD-10-CM

## 2019-11-16 DIAGNOSIS — B029 Zoster without complications: Secondary | ICD-10-CM | POA: Diagnosis not present

## 2019-11-16 DIAGNOSIS — L0390 Cellulitis, unspecified: Secondary | ICD-10-CM | POA: Insufficient documentation

## 2019-11-16 DIAGNOSIS — Z1329 Encounter for screening for other suspected endocrine disorder: Secondary | ICD-10-CM | POA: Diagnosis not present

## 2019-11-16 MED ORDER — PREDNISONE 10 MG PO TABS: ORAL_TABLET | ORAL | 0 refills | Status: DC

## 2019-11-16 MED ORDER — VALACYCLOVIR HCL 1 G PO TABS: 1000.0000 mg | ORAL_TABLET | Freq: Every day | ORAL | 4 refills | Status: DC

## 2019-11-16 MED ORDER — DOXYCYCLINE HYCLATE 100 MG PO TABS: 100.0000 mg | ORAL_TABLET | Freq: Two times a day (BID) | ORAL | 0 refills | Status: DC

## 2019-11-16 NOTE — Assessment & Plan Note (Addendum)
Ongoing issue, now present around exterior right mouth.  Will start on daily Valtrex 1000 MG and Prednisone taper script sent.  Recommend continued use of Triamcinolone cream as needed.  Recommend he go to his ophthalmologist for eye exam ASAP due to two recent outbreaks to face, would benefit from eye exam.   If ongoing and minimal improvement with prophylaxis may benefit from dermatology referral and further lab work-up.  Return in 6 weeks for follow-up.

## 2019-11-16 NOTE — Assessment & Plan Note (Signed)
Paternal grandfather with history of prostate CA.  At length discussion with patient on PSA screening, risks/benefits.  Due to family history he would like to start screening at this time, which is appropriate.  PSA ordered today and will plan to check annually.

## 2019-11-16 NOTE — Assessment & Plan Note (Signed)
Maternal grandfather with history of colon cancer.  Discussed at length with patient, he is interested in starting screening now, may be appropriate based on family history and ethnicity + daily smoker.  Has risk factors present.  Will place referral to GI for further consideration.

## 2019-11-16 NOTE — Assessment & Plan Note (Signed)
To right middle finger.  Ongoing since briar removed from area.  Will start Doxycycline 100 MG BID x 7 days and recommend he continue to rest area.  Warm compresses to finger at least 3-4 times a day for comfort.  May take Tylenol as needed for discomfort.  For worsening or ongoing symptoms then return to office ASAP.

## 2019-11-16 NOTE — Assessment & Plan Note (Signed)
I have recommended complete cessation of tobacco use. I have discussed various options available for assistance with tobacco cessation including over the counter methods (Nicotine gum, patch and lozenges). We also discussed prescription options (Chantix, Nicotine Inhaler / Nasal Spray). The patient is not interested in pursuing any prescription tobacco cessation options at this time.  

## 2019-11-16 NOTE — Progress Notes (Signed)
BP 103/70    Pulse 85    Temp 98.4 F (36.9 C)    Ht 5' 6.81" (1.697 m)    Wt 162 lb 8 oz (73.7 kg)    SpO2 97%    BMI 25.60 kg/m    Subjective:    Patient ID: Rodney Higgins, male    DOB: 1976-01-04, 44 y.o.   MRN: [id number]  HPI: Rodney Higgins is a 44 y.o. male presenting on 11/16/2019 for comprehensive medical examination. Current medical complaints include:none  He currently lives with: wife and children Interim Problems from his last visit: no   RAYNAUD PHENOMENON: Started on Amlodipine last visit for symptom management.  Taking every other day, as he felt taking every day was causing rash, but no rash with every other day and is tolerating well.  Reports he has a recent flare during some cold weather and where as previous times all his fingers would hurt and change color, this time only one finger did this.  Is noticing difference with Amlodipine.  SHINGLES RASH Beginning of April has initial outbreak.  UC visit and was treated for shingles with Valtrex, Prednisone, and triamcinolone cream.   Area to back of scalp improved, but now has areas to right side of face which presented Saturday morning -- burning sensation presented first and then a small spot with yellow oozing.  When initially presented was on left side of face. Continues to use Triamcinolone cream to area which helps.  He reports benefit from Valtrex and Prednisone in past. Duration:  days  Location: posterior scalp  Itching: no Burning: no Redness: yes Oozing: no Scaling: no Blisters: no Painful: no Fevers: no Change in detergents/soaps/personal care products: no Recent illness: no Recent travel:no History of same: yes Context: fluctuating Alleviating factors: Triamcinolone Treatments attempted:Triamcinolone Shortness of breath: no  Throat/tongue swelling: no Myalgias/arthralgias: no   LEFT KNEE OA: Had x-rays several years ago and was told he had degenerative changes.  Had injury to this knee in  high school, area healed incorrectly.  Has ongoing pain with this knee and would like ortho referral. Duration: chronic Involved knee: left Mechanism of injury: unknown Location:anterior Onset: gradual Severity: 7/10  Quality:  dull, aching and throbbing Frequency: intermittent Radiation: no Aggravating factors: weight bearing, walking, running and movement  Alleviating factors: ice, APAP, NSAIDs and rest  Status: fluctuating Treatments attempted: ice, APAP and ibuprofen  Relief with NSAIDs?:  moderate Weakness with weight bearing or walking: no Sensation of giving way: yes Locking: no Popping: no Bruising: no Swelling: no Redness: no Paresthesias/decreased sensation: no Fevers: no   SKIN INFECTION Reports he pulled a briar out of his right middle finger approx 3 weeks ago.  Since this time knuckle on finger has been red and swollen, was oozing at one point.  Having mild pain to area. Duration: weeks Location: right middle finger History of trauma in area: yes Pain: yes Quality: yes Severity: 3/10 Redness: yes Swelling: yes Oozing: no Pus: no Fevers: no Nausea/vomiting: no Status: stable Treatments attempted:ointment at home Tetanus: UTD  Functional Status Survey: Is the patient deaf or have difficulty hearing?: No Does the patient have difficulty seeing, even when wearing glasses/contacts?: No Does the patient have difficulty concentrating, remembering, or making decisions?: No Does the patient have difficulty walking or climbing stairs?: No Does the patient have difficulty dressing or bathing?: No Does the patient have difficulty doing errands alone such as visiting a doctor's office or shopping?: No  FALL RISK:  Fall Risk  11/16/2019  Falls in the past year? 0  Number falls in past yr: 0  Injury with Fall? 0    Depression Screen Depression screen [facility] 2/9 10/19/2019  Decreased Interest 1  Down, Depressed, Hopeless 0  PHQ - 2 Score 1  Altered sleeping 2    Tired, decreased energy 1  Change in appetite 0  Feeling bad or failure about yourself  0  Trouble concentrating 0  Moving slowly or fidgety/restless 0  Suicidal thoughts 0  PHQ-9 Score 4  Difficult doing work/chores Not difficult at all    Advanced Directives <no information>  Past Medical History:  Past Medical History:  Diagnosis Date   Allergy    Arthritis    Heart murmur    diagnosed in 5th to 6th grade -- no echo -- has had EKG   Rash     Surgical History:  Past Surgical History:  Procedure Laterality Date   TONSILECTOMY, ADENOIDECTOMY, BILATERAL MYRINGOTOMY AND TUBES      Medications:  Current Outpatient Medications on File Prior to Visit  Medication Sig   amLODipine (NORVASC) 5 MG tablet Take 1 tablet (5 mg total) by mouth daily. (Patient taking differently: Take 5 mg by mouth daily. Patient states that he takes it every other day, it causes a rash)   cetirizine (ZYRTEC) 10 MG tablet Take 10 mg by mouth daily.   triamcinolone cream (KENALOG) 0.1 % APPLY A THIN LAYER TO AFFECTED AREAS TWICE DAILY AS NEEDED   No current facility-administered medications on file prior to visit.    Allergies:  Allergies  Allergen Reactions   Penicillins Hives    Social History:  Social History   Socioeconomic History   Marital status: Single    Spouse name: Not on file   Number of children: Not on file   Years of education: Not on file   Highest education level: Not on file  Occupational History   Not on file  Tobacco Use   Smoking status: Current Every Day Smoker    Packs/day: 0.25    Types: Cigarettes   Smokeless tobacco: Never Used  Substance and Sexual Activity   Alcohol use: Yes    Alcohol/week: 11.0 standard drinks    Types: 2 Glasses of wine, 6 Cans of beer, 3 Shots of liquor per week    Comment: does not drink Mon through Friday -- only weekends   Drug use: Not Currently    Types: Marijuana   Sexual activity: Yes  Other Topics  Concern   Not on file  Social History Narrative   Not on file   Social Determinants of Health   Financial Resource Strain: Low Risk    Difficulty of Paying Living Expenses: Not hard at all  Food Insecurity: No Food Insecurity   Worried About Programme researcher, broadcasting/film/video in the Last Year: Never true   Ran Out of Food in the Last Year: Never true  Transportation Needs: No Transportation Needs   Lack of Transportation (Medical): No   Lack of Transportation (Non-Medical): No  Physical Activity: Insufficiently Active   Days of Exercise per Week: 3 days   Minutes of Exercise per Session: 30 min  Stress: No Stress Concern Present   Feeling of Stress : Only a little  Social Connections: Unknown   Frequency of Communication with Friends and Family: Three times a week   Frequency of Social Gatherings with Friends and Family: Three times a week   Attends Religious Services: Never  Active Member of Clubs or Organizations: No   Attends BankerClub or Organization Meetings: Never   Marital Status: Not on file  Intimate Partner Violence:    Fear of Current or Ex-Partner:    Emotionally Abused:    Physically Abused:    Sexually Abused:    Social History   Tobacco Use  Smoking Status Current Every Day Smoker   Packs/day: 0.25   Types: Cigarettes  Smokeless Tobacco Never Used   Social History   Substance and Sexual Activity  Alcohol Use Yes   Alcohol/week: 11.0 standard drinks   Types: 2 Glasses of wine, 6 Cans of beer, 3 Shots of liquor per week   Comment: does not drink Mon through Friday -- only weekends    Family History:  Family History  Problem Relation Age of Onset   Hypertension Mother    Lung cancer Father [id number]   Prostate cancer Paternal Grandfather    Heart attack Paternal Grandfather    Diabetes Maternal Uncle    Cancer Paternal Uncle    Stroke Maternal Grandmother    Heart attack Maternal Grandmother    Cancer Maternal Grandfather     Past  medical history, surgical history, medications, allergies, family history and social history reviewed with patient today and changes made to appropriate areas of the chart.   Review of Systems - negative All other ROS negative except what is listed above and in the HPI.      Objective:    BP 103/70    Pulse 85    Temp 98.4 F (36.9 C)    Ht 5' 6.81" (1.697 m)    Wt 162 lb 8 oz (73.7 kg)    SpO2 97%    BMI 25.60 kg/m   Wt Readings from Last 3 Encounters:  11/16/19 162 lb 8 oz (73.7 kg)  10/19/19 165 lb (74.8 kg)  03/27/18 166 lb 6.4 oz (75.5 kg)    Physical Exam Vitals and nursing note reviewed.  Constitutional:      General: He is awake. He is not in acute distress.    Appearance: He is well-developed and well-groomed. He is not ill-appearing.  HENT:     Head: Normocephalic and atraumatic.     Right Ear: Hearing, tympanic membrane, ear canal and external ear normal. No drainage.     Left Ear: Hearing, tympanic membrane, ear canal and external ear normal. No drainage.     Nose: Nose normal.     Mouth/Throat:     Pharynx: Uvula midline.  Eyes:     General: Lids are normal.        Right eye: No discharge.        Left eye: No discharge.     Extraocular Movements: Extraocular movements intact.     Conjunctiva/sclera: Conjunctivae normal.     Pupils: Pupils are equal, round, and reactive to light.     Visual Fields: Right eye visual fields normal and left eye visual fields normal.  Neck:     Thyroid: No thyromegaly.     Vascular: No carotid bruit or JVD.     Trachea: Trachea normal.  Cardiovascular:     Rate and Rhythm: Normal rate and regular rhythm.     Heart sounds: Normal heart sounds, S1 normal and S2 normal. No murmur. No gallop.   Pulmonary:     Effort: Pulmonary effort is normal. No accessory muscle usage or respiratory distress.     Breath sounds: Normal breath sounds.  Abdominal:  General: Bowel sounds are normal.     Palpations: Abdomen is soft. There is no  hepatomegaly or splenomegaly.     Tenderness: There is no abdominal tenderness.  Musculoskeletal:        General: Normal range of motion.     Cervical back: Normal range of motion and neck supple.     Right knee: Normal.     Left knee: Crepitus present. No swelling, erythema, ecchymosis, lacerations or bony tenderness. Normal range of motion. No tenderness.     Right lower leg: No edema.     Left lower leg: No edema.  Lymphadenopathy:     Head:     Right side of head: No submental, submandibular, tonsillar, preauricular or posterior auricular adenopathy.     Left side of head: No submental, submandibular, tonsillar, preauricular or posterior auricular adenopathy.     Cervical: No cervical adenopathy.  Skin:    General: Skin is warm and dry.     Capillary Refill: Capillary refill takes less than 2 seconds.     Findings: Rash present.          Comments: To corner of right side mouth rash present, vesicular in nature with no drainage, mild erythema present around exterior.  A few scattered vesicles to lower right cheek, intact.    Neurological:     Mental Status: He is alert and oriented to person, place, and time.     Cranial Nerves: Cranial nerves are intact.     Gait: Gait is intact.     Deep Tendon Reflexes: Reflexes are normal and symmetric.     Reflex Scores:      Brachioradialis reflexes are 2+ on the right side and 2+ on the left side.      Patellar reflexes are 2+ on the right side and 2+ on the left side. Psychiatric:        Attention and Perception: Attention normal.        Mood and Affect: Mood normal.        Speech: Speech normal.        Behavior: Behavior normal. Behavior is cooperative.        Thought Content: Thought content normal.        Cognition and Memory: Cognition normal.        Judgment: Judgment normal.    Results for orders placed or performed during the hospital encounter of 03/28/16  Basic metabolic panel  Result Value Ref Range   Sodium 141 135 -  145 mmol/L   Potassium 3.7 3.5 - 5.1 mmol/L   Chloride 107 101 - 111 mmol/L   CO2 28 22 - 32 mmol/L   Glucose, Bld 87 65 - 99 mg/dL   BUN 13 6 - 20 mg/dL   Creatinine, Ser 6.19 0.61 - 1.24 mg/dL   Calcium 9.2 8.9 - 50.9 mg/dL   GFR calc non Af Amer >60 >60 mL/min   GFR calc Af Amer >60 >60 mL/min   Anion gap 6 5 - 15  CBC  Result Value Ref Range   WBC 6.5 3.8 - 10.6 K/uL   RBC 5.16 4.40 - 5.90 MIL/uL   Hemoglobin 15.5 13.0 - 18.0 g/dL   HCT 32.6 71.2 - 45.8 %   MCV 90.4 80.0 - 100.0 fL   MCH 30.1 26.0 - 34.0 pg   MCHC 33.3 32.0 - 36.0 g/dL   RDW 09.9 83.3 - 82.5 %   Platelets 205 150 - 440 K/uL  Urinalysis complete, with  microscopic  Result Value Ref Range   Color, Urine YELLOW (A) YELLOW   APPearance CLEAR (A) CLEAR   Glucose, UA NEGATIVE NEGATIVE mg/dL   Bilirubin Urine NEGATIVE NEGATIVE   Ketones, ur TRACE (A) NEGATIVE mg/dL   Specific Gravity, Urine 1.016 1.005 - 1.030   Hgb urine dipstick NEGATIVE NEGATIVE   pH 5.0 5.0 - 8.0   Protein, ur NEGATIVE NEGATIVE mg/dL   Nitrite NEGATIVE NEGATIVE   Leukocytes, UA NEGATIVE NEGATIVE   RBC / HPF 0-5 0 - 5 RBC/hpf   WBC, UA 0-5 0 - 5 WBC/hpf   Bacteria, UA NONE SEEN NONE SEEN   Squamous Epithelial / LPF 0-5 (A) NONE SEEN   Mucus PRESENT   Troponin I  Result Value Ref Range   Troponin I <0.03 <0.03 ng/mL      Assessment & Plan:   Problem List Items Addressed This Visit      Musculoskeletal and Integument   Osteoarthritis of left knee    Reports long standing history of this.  Would like ortho referral to further assess knee due to ongoing pain and concerns for need for replacement in future.  Will place referral.  Recommend continued use of Tylenol and Aleeve as needed.  Alternate between ice and heat + perform gentle stretches daily.  Use of knee brace while at work.  Return to office for worsening or ongoing pain.      Relevant Medications   predniSONE (DELTASONE) 10 MG tablet   Other Relevant Orders   Ambulatory  referral to Orthopedics     Other   Shingles rash    Ongoing issue, now present around exterior right mouth.  Will start on daily Valtrex 1000 MG and Prednisone taper script sent.  Recommend continued use of Triamcinolone cream as needed.  Recommend he go to his ophthalmologist for eye exam ASAP due to two recent outbreaks to face, would benefit from eye exam.   If ongoing and minimal improvement with prophylaxis may benefit from dermatology referral and further lab work-up.  Return in 6 weeks for follow-up.      Relevant Medications   valACYclovir (VALTREX) 1000 MG tablet   Raynaud phenomenon    Ongoing with improvement with Amlodipine on board.  Continue current medication regimen and adjust as needed.  Avoid cold exposure when possible.  Wear hand warmers if unable to avoid cold exposure and using hands.  Recommend use of warm gloves at work.        Relevant Orders   CBC with Differential/Platelet   Comprehensive metabolic panel   Nicotine dependence, cigarettes, uncomplicated    I have recommended complete cessation of tobacco use. I have discussed various options available for assistance with tobacco cessation including over the counter methods (Nicotine gum, patch and lozenges). We also discussed prescription options (Chantix, Nicotine Inhaler / Nasal Spray). The patient is not interested in pursuing any prescription tobacco cessation options at this time.       Cellulitis    To right middle finger.  Ongoing since briar removed from area.  Will start Doxycycline 100 MG BID x 7 days and recommend he continue to rest area.  Warm compresses to finger at least 3-4 times a day for comfort.  May take Tylenol as needed for discomfort.  For worsening or ongoing symptoms then return to office ASAP.      Family history of prostate cancer    Paternal grandfather with history of prostate CA.  At length discussion with patient on  PSA screening, risks/benefits.  Due to family history he would like  to start screening at this time, which is appropriate.  PSA ordered today and will plan to check annually.      Relevant Orders   PSA   Family history of colon cancer    Maternal grandfather with history of colon cancer.  Discussed at length with patient, he is interested in starting screening now, may be appropriate based on family history and ethnicity + daily smoker.  Has risk factors present.  Will place referral to GI for further consideration.      Relevant Orders   Ambulatory referral to Gastroenterology    Other Visit Diagnoses    Encounter for annual health examination    -  Primary   Obtain annual labs today to include CBC, CMP, TSH, lipid   Screening cholesterol level       Lipid panel today   Relevant Orders   Lipid Panel w/o Chol/HDL Ratio   Thyroid disorder screen       TSH check today   Relevant Orders   TSH   Immunization due       Relevant Orders   Tdap vaccine greater than or equal to 7yo IM (Completed)       Discussed aspirin prophylaxis for myocardial infarction prevention and decision was it was not indicated  LABORATORY TESTING:  Health maintenance labs ordered today as discussed above.   The natural history of prostate cancer and ongoing controversy regarding screening and potential treatment outcomes of prostate cancer has been discussed with the patient. The meaning of a false positive PSA and a false negative PSA has been discussed. He indicates understanding of the limitations of this screening test and wishes to proceed with screening PSA testing. History of paternal grandfather with prostate cancer.   IMMUNIZATIONS:   - Tdap: Tetanus vaccination status reviewed: last tetanus booster within 10 years. - Influenza: Up to date - Pneumovax: Not applicable - Prevnar: Not applicable - Zostavax vaccine: Not applicable  SCREENING: - Colonoscopy: Ordered today -- family history of maternal grandfather with colon cancer Discussed with patient purpose of  the colonoscopy is to detect colon cancer at curable precancerous or early stages   - AAA Screening: Not applicable  -Hearing Test: Not applicable  -Spirometry: Not applicable   PATIENT COUNSELING:    Sexuality: Discussed sexually transmitted diseases, partner selection, use of condoms, avoidance of unintended pregnancy  and contraceptive alternatives.   Advised to avoid cigarette smoking.  I discussed with the patient that most people either abstain from alcohol or drink within safe limits (<=14/week and <=4 drinks/occasion for males, <=7/weeks and <= 3 drinks/occasion for females) and that the risk for alcohol disorders and other health effects rises proportionally with the number of drinks per week and how often a drinker exceeds daily limits.  Discussed cessation/primary prevention of drug use and availability of treatment for abuse.   Diet: Encouraged to adjust caloric intake to maintain  or achieve ideal body weight, to reduce intake of dietary saturated fat and total fat, to limit sodium intake by avoiding high sodium foods and not adding table salt, and to maintain adequate dietary potassium and calcium preferably from fresh fruits, vegetables, and low-fat dairy products.    stressed the importance of regular exercise  Injury prevention: Discussed safety belts, safety helmets, smoke detector, smoking near bedding or upholstery.   Dental health: Discussed importance of regular tooth brushing, flossing, and dental visits.   Follow up plan: NEXT  PREVENTATIVE PHYSICAL DUE IN 1 YEAR. Return in about 6 weeks (around 12/28/2019) for Shingles.

## 2019-11-16 NOTE — Assessment & Plan Note (Signed)
Ongoing with improvement with Amlodipine on board.  Continue current medication regimen and adjust as needed.  Avoid cold exposure when possible.  Wear hand warmers if unable to avoid cold exposure and using hands.  Recommend use of warm gloves at work.

## 2019-11-16 NOTE — Patient Instructions (Signed)
Shingles  Shingles is an infection. It gives you a painful skin rash and blisters that have fluid in them. Shingles is caused by the same germ (virus) that causes chickenpox. Shingles only happens in people who:  Have had chickenpox.  Have been given a shot of medicine (vaccine) to protect against chickenpox. Shingles is rare in this group. The first symptoms of shingles may be itching, tingling, or pain in an area on your skin. A rash will show on your skin a few days or weeks later. The rash is likely to be on one side of your body. The rash usually has a shape like a belt or a band. Over time, the rash turns into fluid-filled blisters. The blisters will break open, change into scabs, and dry up. Medicines may:  Help with pain and itching.  Help you get better sooner.  Help to prevent long-term problems. Follow these instructions at home: Medicines  Take over-the-counter and prescription medicines only as told by your doctor.  Put on an anti-itch cream or numbing cream where you have a rash, blisters, or scabs. Do this as told by your doctor. Helping with itching and discomfort   Put cold, wet cloths (cold compresses) on the area of the rash or blisters as told by your doctor.  Cool baths can help you feel better. Try adding baking soda or dry oatmeal to the water to lessen itching. Do not bathe in hot water. Blister and rash care  Keep your rash covered with a loose bandage (dressing).  Wear loose clothing that does not rub on your rash.  Keep your rash and blisters clean. To do this, wash the area with mild soap and cool water as told by your doctor.  Check your rash every day for signs of infection. Check for: ? More redness, swelling, or pain. ? Fluid or blood. ? Warmth. ? Pus or a bad smell.  Do not scratch your rash. Do not pick at your blisters. To help you to not scratch: ? Keep your fingernails clean and cut short. ? Wear gloves or mittens when you sleep, if  scratching is a problem. General instructions  Rest as told by your doctor.  Keep all follow-up visits as told by your doctor. This is important.  Wash your hands often with soap and water. If soap and water are not available, use hand sanitizer. Doing this lowers your chance of getting a skin infection caused by germs (bacteria).  Your infection can cause chickenpox in people who have never had chickenpox or never got a shot of chickenpox vaccine. If you have blisters that did not change into scabs yet, try not to touch other people or be around other people, especially: ? Babies. ? Pregnant women. ? Children who have areas of red, itchy, or rough skin (eczema). ? Very old people who have transplants. ? People who have a long-term (chronic) sickness, like cancer or AIDS. Contact a doctor if:  Your pain does not get better with medicine.  Your pain does not get better after the rash heals.  You have any signs of infection in the rash area. These signs include: ? More redness, swelling, or pain around the rash. ? Fluid or blood coming from the rash. ? The rash area feeling warm to the touch. ? Pus or a bad smell coming from the rash. Get help right away if:  The rash is on your face or nose.  You have pain in your face or pain by   your eye.  You lose feeling on one side of your face.  You have trouble seeing.  You have ear pain, or you have ringing in your ear.  You have a loss of taste.  Your condition gets worse. Summary  Shingles gives you a painful skin rash and blisters that have fluid in them.  Shingles is an infection. It is caused by the same germ (virus) that causes chickenpox.  Keep your rash covered with a loose bandage (dressing). Wear loose clothing that does not rub on your rash.  If you have blisters that did not change into scabs yet, try not to touch other people or be around people. This information is not intended to replace advice given to you by  your health care provider. Make sure you discuss any questions you have with your health care provider. Document Revised: 10/09/2018 Document Reviewed: 02/19/2017 Elsevier Patient Education  2020 [employer]  

## 2019-11-16 NOTE — Assessment & Plan Note (Signed)
Reports long standing history of this.  Would like ortho referral to further assess knee due to ongoing pain and concerns for need for replacement in future.  Will place referral.  Recommend continued use of Tylenol and Aleeve as needed.  Alternate between ice and heat + perform gentle stretches daily.  Use of knee brace while at work.  Return to office for worsening or ongoing pain.

## 2019-11-17 LAB — LIPID PANEL W/O CHOL/HDL RATIO
Cholesterol, Total: 160 mg/dL (ref 100–199)
HDL: 55 mg/dL (ref >39)
LDL Chol Calc (NIH): 77 mg/dL (ref 0–99)
Triglycerides: 163 mg/dL — ABNORMAL HIGH (ref 0–149)
VLDL Cholesterol Cal: 28 mg/dL (ref 5–40)

## 2019-11-17 LAB — CBC WITH DIFFERENTIAL/PLATELET
Basophils Absolute: 0.1 10*3/uL (ref 0.0–0.2)
Basos: 2 %
EOS (ABSOLUTE): 0.2 10*3/uL (ref 0.0–0.4)
Eos: 3 %
Hematocrit: 46 % (ref 37.5–51.0)
Hemoglobin: 15.9 g/dL (ref 13.0–17.7)
Immature Grans (Abs): 0 10*3/uL (ref 0.0–0.1)
Immature Granulocytes: 0 %
Lymphocytes Absolute: 1.8 10*3/uL (ref 0.7–3.1)
Lymphs: 37 %
MCH: 31 pg (ref 26.6–33.0)
MCHC: 34.6 g/dL (ref 31.5–35.7)
MCV: 90 fL (ref 79–97)
Monocytes Absolute: 0.3 10*3/uL (ref 0.1–0.9)
Monocytes: 7 %
Neutrophils Absolute: 2.6 10*3/uL (ref 1.4–7.0)
Neutrophils: 51 %
Platelets: 289 10*3/uL (ref 150–450)
RBC: 5.13 x10E6/uL (ref 4.14–5.80)
RDW: 13.2 % (ref 11.6–15.4)
WBC: 5 10*3/uL (ref 3.4–10.8)

## 2019-11-17 LAB — COMPREHENSIVE METABOLIC PANEL
ALT: 22 IU/L (ref 0–44)
AST: 25 IU/L (ref 0–40)
Albumin/Globulin Ratio: 1.7 (ref 1.2–2.2)
Albumin: 4.6 g/dL (ref 4.0–5.0)
Alkaline Phosphatase: 80 IU/L (ref 48–121)
BUN/Creatinine Ratio: 8 — ABNORMAL LOW (ref 9–20)
BUN: 9 mg/dL (ref 6–24)
Bilirubin Total: 0.3 mg/dL (ref 0.0–1.2)
CO2: 26 mmol/L (ref 20–29)
Calcium: 9.9 mg/dL (ref 8.7–10.2)
Chloride: 104 mmol/L (ref 96–106)
Creatinine, Ser: 1.09 mg/dL (ref 0.76–1.27)
GFR calc Af Amer: 95 mL/min/{1.73_m2} (ref >59)
GFR calc non Af Amer: 82 mL/min/{1.73_m2} (ref >59)
Globulin, Total: 2.7 g/dL (ref 1.5–4.5)
Glucose: 88 mg/dL (ref 65–99)
Potassium: 4.8 mmol/L (ref 3.5–5.2)
Sodium: 141 mmol/L (ref 134–144)
Total Protein: 7.3 g/dL (ref 6.0–8.5)

## 2019-11-17 LAB — PSA: Prostate Specific Ag, Serum: 2 ng/mL (ref 0.0–4.0)

## 2019-11-17 LAB — TSH: TSH: 1.54 u[IU]/mL (ref 0.450–4.500)

## 2019-11-17 NOTE — Addendum Note (Signed)
Addended by: Nils Pyle R on: 11/17/2019 04:53 PM   Modules accepted: Orders

## 2019-11-17 NOTE — Addendum Note (Signed)
Addended by: Aura Dials T on: 11/17/2019 04:52 PM   Modules accepted: Orders

## 2019-11-18 LAB — HSV(HERPES SIMPLEX VRS) I + II AB-IGG
HSV 1 Glycoprotein G Ab, IgG: 15.7 index — ABNORMAL HIGH (ref 0.00–0.90)
HSV 2 IgG, Type Spec: <0.91 index (ref 0.00–0.90)

## 2019-11-18 LAB — RPR: RPR Ser Ql: NONREACTIVE

## 2019-11-18 NOTE — Progress Notes (Signed)
I attempted to call patient, but it did a [employer] screening and he was not available, please try to call or send letter with following: Good afternoon Mr.  Odriscoll your labs have returned and overall they look good.  Normal thyroid, kidney, and liver function.  Prostate lab normal.  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  HSV2 (genital herpes) was negative.  If any questions let me know.  Have a great day!!

## 2019-11-25 ENCOUNTER — Encounter (INDEPENDENT_AMBULATORY_CARE_PROVIDER_SITE_OTHER): Payer: Self-pay

## 2019-11-25 ENCOUNTER — Telehealth (INDEPENDENT_AMBULATORY_CARE_PROVIDER_SITE_OTHER): Payer: Self-pay | Admitting: Gastroenterology

## 2019-11-25 DIAGNOSIS — Z800 Family history of malignant neoplasm of digestive organs: Secondary | ICD-10-CM

## 2019-11-25 DIAGNOSIS — Z1211 Encounter for screening for malignant neoplasm of colon: Secondary | ICD-10-CM

## 2019-11-25 NOTE — Progress Notes (Signed)
Gastroenterology Pre-Procedure Review  Request Date: Friday 01/07/20 Requesting Physician: Dr. Servando Snare  PATIENT REVIEW QUESTIONS: The patient responded to the following health history questions as indicated:    1. Are you having any GI issues? no 2. Do you have a personal history of Polyps? no 3. Do you have a family history of Colon Cancer or Polyps? yes (Maternal grandfather colon cancer) 4. Diabetes Mellitus? no 5. Joint replacements in the past 12 months?no 6. Major health problems in the past 3 months?no 7. Any artificial heart valves, MVP, or defibrillator?no    MEDICATIONS & ALLERGIES:    Patient reports the following regarding taking any anticoagulation/antiplatelet therapy:   Plavix, Coumadin, Eliquis, Xarelto, Lovenox, Pradaxa, Brilinta, or Effient? no Aspirin? no  Patient confirms/reports the following medications:  Current Outpatient Medications  Medication Sig Dispense Refill  . amLODipine (NORVASC) 5 MG tablet Take 1 tablet (5 mg total) by mouth daily. (Patient taking differently: Take 5 mg by mouth daily. Patient states that he takes it every other day, it causes a rash) 90 tablet 3  . cetirizine (ZYRTEC) 10 MG tablet Take 10 mg by mouth daily.    Marland Kitchen doxycycline (VIBRA-TABS) 100 MG tablet Take 1 tablet (100 mg total) by mouth 2 (two) times daily. 20 tablet 0  . predniSONE (DELTASONE) 10 MG tablet Take 6 tablets by mouth daily for 2 days, then reduce by 1 tablet every 2 days until gone 42 tablet 0  . triamcinolone cream (KENALOG) 0.1 % APPLY A THIN LAYER TO AFFECTED AREAS TWICE DAILY AS NEEDED    . valACYclovir (VALTREX) 1000 MG tablet Take 1 tablet (1,000 mg total) by mouth daily. 90 tablet 4   No current facility-administered medications for this visit.    Patient confirms/reports the following allergies:  Allergies  Allergen Reactions  . Penicillins Hives    No orders of the defined types were placed in this encounter.   AUTHORIZATION INFORMATION Primary  Insurance: 1D#: Group #:  Secondary Insurance: 1D#: Group #:  SCHEDULE INFORMATION: Date: 01/07/20 Time: Location:[facility]

## 2019-12-28 ENCOUNTER — Ambulatory Visit (INDEPENDENT_AMBULATORY_CARE_PROVIDER_SITE_OTHER): Payer: Commercial Managed Care - PPO | Admitting: Nurse Practitioner

## 2019-12-28 ENCOUNTER — Other Ambulatory Visit: Payer: Self-pay

## 2019-12-28 ENCOUNTER — Encounter: Payer: Self-pay | Admitting: Nurse Practitioner

## 2019-12-28 DIAGNOSIS — B029 Zoster without complications: Secondary | ICD-10-CM

## 2019-12-28 NOTE — Progress Notes (Signed)
BP 108/65   Pulse 99   Temp 98.2 F (36.8 C) (Oral)   Wt 168 lb 3.2 oz (76.3 kg)   SpO2 98%   BMI 26.49 kg/m    Subjective:    Patient ID: Rodney Higgins, male    DOB: 03-Sep-1975, 44 y.o.   MRN: [id number]  HPI: Rodney Higgins is a 44 y.o. male  Chief Complaint  Patient presents with  . Herpes Zoster    6 week f/up   SHINGLES RASH Beginning of April had initial outbreak.  [facility] visit and was treated for shingles with Valtrex, Prednisone, and triamcinolone cream. At last visit continued to have issues with outbreaks, labs performed and noted positive for HSV1 -- was started at last visit on daily Valtrex due to ongoing issues.  Rash improved after two days. Duration:days Location:posterior scalp Itching:no Burning:no Redness:yes Oozing:no Scaling:no Blisters:no Painful:no Fevers:no Change in detergents/soaps/personal care products:no Recent illness:no Recent travel:no History of same:yes Context:fluctuating Alleviating factors:Triamcinolone Treatments attempted:Triamcinolone Shortness of breath:no Throat/tongue swelling:no Myalgias/arthralgias:no  Relevant past medical, surgical, family and social history reviewed and updated as indicated. Interim medical history since our last visit reviewed. Allergies and medications reviewed and updated.  Review of Systems  Constitutional: Negative for activity change, diaphoresis, fatigue and fever.  Respiratory: Negative for cough, chest tightness, shortness of breath and wheezing.   Cardiovascular: Negative for chest pain, palpitations and leg swelling.  Gastrointestinal: Negative.   Skin: Negative for rash.  Neurological: Negative.   Psychiatric/Behavioral: Negative.     Per HPI unless specifically indicated above     Objective:    BP 108/65   Pulse 99   Temp 98.2 F (36.8 C) (Oral)   Wt 168 lb 3.2 oz (76.3 kg)   SpO2 98%   BMI 26.49 kg/m   Wt Readings from Last 3 Encounters:   12/28/19 168 lb 3.2 oz (76.3 kg)  11/16/19 162 lb 8 oz (73.7 kg)  10/19/19 165 lb (74.8 kg)    Physical Exam Vitals and nursing note reviewed.  Constitutional:      General: He is awake. He is not in acute distress.    Appearance: He is well-developed and well-groomed. He is not ill-appearing.  HENT:     Head: Normocephalic and atraumatic.     Right Ear: Hearing normal. No drainage.     Left Ear: Hearing normal. No drainage.  Eyes:     General: Lids are normal.        Right eye: No discharge.        Left eye: No discharge.     Conjunctiva/sclera: Conjunctivae normal.     Pupils: Pupils are equal, round, and reactive to light.  Neck:     Trachea: Trachea normal.  Cardiovascular:     Rate and Rhythm: Normal rate and regular rhythm.     Heart sounds: Normal heart sounds, S1 normal and S2 normal. No murmur heard.  No gallop.   Pulmonary:     Effort: Pulmonary effort is normal. No accessory muscle usage or respiratory distress.     Breath sounds: Normal breath sounds.  Abdominal:     General: Bowel sounds are normal.     Palpations: Abdomen is soft.  Musculoskeletal:        General: Normal range of motion.     Cervical back: Normal range of motion and neck supple.     Right lower leg: No edema.     Left lower leg: No edema.  Skin:  General: Skin is warm and dry.     Capillary Refill: Capillary refill takes less than 2 seconds.     Findings: No rash.     Comments: No further rashes to scalp or around mouth.  Neurological:     Mental Status: He is alert and oriented to person, place, and time.  Psychiatric:        Attention and Perception: Attention normal.        Mood and Affect: Mood normal.        Speech: Speech normal.        Behavior: Behavior normal. Behavior is cooperative.        Thought Content: Thought content normal.     Results for orders placed or performed in visit on 11/16/19  CBC with Differential/Platelet  Result Value Ref Range   WBC 5.0 3.4 -  10.8 x10E3/uL   RBC 5.13 4.14 - 5.80 x10E6/uL   Hemoglobin 15.9 13.0 - 17.7 g/dL   Hematocrit 63.7 85.8 - 51.0 %   MCV 90 79 - 97 fL   MCH 31.0 26.6 - 33.0 pg   MCHC 34.6 31 - 35 g/dL   RDW 85.0 27.7 - 41.2 %   Platelets 289 150 - 450 x10E3/uL   Neutrophils 51 Not Estab. %   Lymphs 37 Not Estab. %   Monocytes 7 Not Estab. %   Eos 3 Not Estab. %   Basos 2 Not Estab. %   Neutrophils Absolute 2.6 1 - 7 x10E3/uL   Lymphocytes Absolute 1.8 0 - 3 x10E3/uL   Monocytes Absolute 0.3 0 - 0 x10E3/uL   EOS (ABSOLUTE) 0.2 0.0 - 0.4 x10E3/uL   Basophils Absolute 0.1 0 - 0 x10E3/uL   Immature Granulocytes 0 Not Estab. %   Immature Grans (Abs) 0.0 0.0 - 0.1 x10E3/uL  Comprehensive metabolic panel  Result Value Ref Range   Glucose 88 65 - 99 mg/dL   BUN 9 6 - 24 mg/dL   Creatinine, Ser 8.78 0.76 - 1.27 mg/dL   GFR calc non Af Amer 82 >59 mL/min/1.73   GFR calc Af Amer 95 >59 mL/min/1.73   BUN/Creatinine Ratio 8 (L) 9 - 20   Sodium 141 134 - 144 mmol/L   Potassium 4.8 3.5 - 5.2 mmol/L   Chloride 104 96 - 106 mmol/L   CO2 26 20 - 29 mmol/L   Calcium 9.9 8.7 - 10.2 mg/dL   Total Protein 7.3 6.0 - 8.5 g/dL   Albumin 4.6 4.0 - 5.0 g/dL   Globulin, Total 2.7 1.5 - 4.5 g/dL   Albumin/Globulin Ratio 1.7 1.2 - 2.2   Bilirubin Total 0.3 0.0 - 1.2 mg/dL   Alkaline Phosphatase 80 48 - 121 IU/L   AST 25 0 - 40 IU/L   ALT 22 0 - 44 IU/L  PSA  Result Value Ref Range   Prostate Specific Ag, Serum 2.0 0.0 - 4.0 ng/mL  TSH  Result Value Ref Range   TSH 1.540 0.450 - 4.500 uIU/mL  Lipid Panel w/o Chol/HDL Ratio  Result Value Ref Range   Cholesterol, Total 160 100 - 199 mg/dL   Triglycerides 676 (H) 0 - 149 mg/dL   HDL 55 >72 mg/dL   VLDL Cholesterol Cal 28 5 - 40 mg/dL   LDL Chol Calc (NIH) 77 0 - 99 mg/dL  HSV(herpes simplex vrs) 1+2 ab-IgG  Result Value Ref Range   HSV 1 Glycoprotein G Ab, IgG 15.70 (H) 0.00 - 0.90 index   HSV 2  IgG, Type Spec <0.91 0.00 - 0.90 index  RPR  Result Value Ref  Range   RPR Ser Ql Non Reactive Non Reactive      Assessment & Plan:   Problem List Items Addressed This Visit      Other   Shingles rash    Currently improved, will continue Valtrex for prophylaxis.  ?Shingles vs HSV1 rash, was having continued outbreaks, which have improved with daily Valtrex.  Continue regimen and return for annual physical in April 2022.          Follow up plan: Return in about 10 months (around 10/27/2020) for Annual physical.

## 2019-12-28 NOTE — Assessment & Plan Note (Signed)
Currently improved, will continue Valtrex for prophylaxis.  ?Shingles vs HSV1 rash, was having continued outbreaks, which have improved with daily Valtrex.  Continue regimen and return for annual physical in April 2022.

## 2019-12-28 NOTE — Patient Instructions (Signed)
Shingles  Shingles is an infection. It gives you a painful skin rash and blisters that have fluid in them. Shingles is caused by the same germ (virus) that causes chickenpox. Shingles only happens in people who:  Have had chickenpox.  Have been given a shot of medicine (vaccine) to protect against chickenpox. Shingles is rare in this group. The first symptoms of shingles may be itching, tingling, or pain in an area on your skin. A rash will show on your skin a few days or weeks later. The rash is likely to be on one side of your body. The rash usually has a shape like a belt or a band. Over time, the rash turns into fluid-filled blisters. The blisters will break open, change into scabs, and dry up. Medicines may:  Help with pain and itching.  Help you get better sooner.  Help to prevent long-term problems. Follow these instructions at home: Medicines  Take over-the-counter and prescription medicines only as told by your doctor.  Put on an anti-itch cream or numbing cream where you have a rash, blisters, or scabs. Do this as told by your doctor. Helping with itching and discomfort   Put cold, wet cloths (cold compresses) on the area of the rash or blisters as told by your doctor.  Cool baths can help you feel better. Try adding baking soda or dry oatmeal to the water to lessen itching. Do not bathe in hot water. Blister and rash care  Keep your rash covered with a loose bandage (dressing).  Wear loose clothing that does not rub on your rash.  Keep your rash and blisters clean. To do this, wash the area with mild soap and cool water as told by your doctor.  Check your rash every day for signs of infection. Check for: ? More redness, swelling, or pain. ? Fluid or blood. ? Warmth. ? Pus or a bad smell.  Do not scratch your rash. Do not pick at your blisters. To help you to not scratch: ? Keep your fingernails clean and cut short. ? Wear gloves or mittens when you sleep, if  scratching is a problem. General instructions  Rest as told by your doctor.  Keep all follow-up visits as told by your doctor. This is important.  Wash your hands often with soap and water. If soap and water are not available, use hand sanitizer. Doing this lowers your chance of getting a skin infection caused by germs (bacteria).  Your infection can cause chickenpox in people who have never had chickenpox or never got a shot of chickenpox vaccine. If you have blisters that did not change into scabs yet, try not to touch other people or be around other people, especially: ? Babies. ? Pregnant women. ? Children who have areas of red, itchy, or rough skin (eczema). ? Very old people who have transplants. ? People who have a long-term (chronic) sickness, like cancer or AIDS. Contact a doctor if:  Your pain does not get better with medicine.  Your pain does not get better after the rash heals.  You have any signs of infection in the rash area. These signs include: ? More redness, swelling, or pain around the rash. ? Fluid or blood coming from the rash. ? The rash area feeling warm to the touch. ? Pus or a bad smell coming from the rash. Get help right away if:  The rash is on your face or nose.  You have pain in your face or pain by   your eye.  You lose feeling on one side of your face.  You have trouble seeing.  You have ear pain, or you have ringing in your ear.  You have a loss of taste.  Your condition gets worse. Summary  Shingles gives you a painful skin rash and blisters that have fluid in them.  Shingles is an infection. It is caused by the same germ (virus) that causes chickenpox.  Keep your rash covered with a loose bandage (dressing). Wear loose clothing that does not rub on your rash.  If you have blisters that did not change into scabs yet, try not to touch other people or be around people. This information is not intended to replace advice given to you by  your health care provider. Make sure you discuss any questions you have with your health care provider. Document Revised: 10/09/2018 Document Reviewed: 02/19/2017 Elsevier Patient Education  2020 [employer]  

## 2019-12-30 ENCOUNTER — Other Ambulatory Visit: Payer: Self-pay

## 2019-12-30 ENCOUNTER — Encounter: Payer: Self-pay | Admitting: Gastroenterology

## 2020-01-05 ENCOUNTER — Other Ambulatory Visit
Admission: RE | Admit: 2020-01-05 | Discharge: 2020-01-05 | Disposition: A | Discharge status: Home / Self Care | Payer: Commercial Managed Care - PPO | Source: Ambulatory Visit | Attending: Gastroenterology | Admitting: Gastroenterology

## 2020-01-05 ENCOUNTER — Other Ambulatory Visit: Payer: Self-pay

## 2020-01-05 DIAGNOSIS — Z01812 Encounter for preprocedural laboratory examination: Secondary | ICD-10-CM | POA: Diagnosis present

## 2020-01-05 DIAGNOSIS — Z20822 Contact with and (suspected) exposure to covid-19: Secondary | ICD-10-CM | POA: Insufficient documentation

## 2020-01-05 LAB — SARS CORONAVIRUS 2 (TAT 6-24 HRS): SARS Coronavirus 2: NEGATIVE

## 2020-01-05 MED ORDER — CLENPIQ 10-3.5-12 MG-GM -GM/160ML PO SOLN: 320.0000 mL | Freq: Every day | ORAL | 0 refills | Status: DC

## 2020-01-05 NOTE — Progress Notes (Signed)
Patient is calling to inform us that his suprep is not cover by his insurance. Informed patient I would send a alternative to the pharmacy and gave him instructions on how to take it. He verbalized understanding

## 2020-01-06 NOTE — Discharge Instructions (Signed)
General Anesthesia, Adult, Care After This sheet gives you information about how to care for yourself after your procedure. Your health care provider may also give you more specific instructions. If you have problems or questions, contact your health care provider. What can I expect after the procedure? After the procedure, the following side effects are common:  Pain or discomfort at the IV site.  Nausea.  Vomiting.  Sore throat.  Trouble concentrating.  Feeling cold or chills.  Weak or tired.  Sleepiness and fatigue.  Soreness and body aches. These side effects can affect parts of the body that were not involved in surgery. Follow these instructions at home:  For at least 24 hours after the procedure:  Have a responsible adult stay with you. It is important to have someone help care for you until you are awake and alert.  Rest as needed.  Do not: ? Participate in activities in which you could fall or become injured. ? Drive. ? Use heavy machinery. ? Drink alcohol. ? Take sleeping pills or medicines that cause drowsiness. ? Make important decisions or sign legal documents. ? Take care of children on your own. Eating and drinking  Follow any instructions from your health care provider about eating or drinking restrictions.  When you feel hungry, start by eating small amounts of foods that are soft and easy to digest (bland), such as toast. Gradually return to your regular diet.  Drink enough fluid to keep your urine pale yellow.  If you vomit, rehydrate by drinking water, juice, or clear broth. General instructions  If you have sleep apnea, surgery and certain medicines can increase your risk for breathing problems. Follow instructions from your health care provider about wearing your sleep device: ? Anytime you are sleeping, including during daytime naps. ? While taking prescription pain medicines, sleeping medicines, or medicines that make you drowsy.  Return to  your normal activities as told by your health care provider. Ask your health care provider what activities are safe for you.  Take over-the-counter and prescription medicines only as told by your health care provider.  If you smoke, do not smoke without supervision.  Keep all follow-up visits as told by your health care provider. This is important. Contact a health care provider if:  You have nausea or vomiting that does not get better with medicine.  You cannot eat or drink without vomiting.  You have pain that does not get better with medicine.  You are unable to pass urine.  You develop a skin rash.  You have a fever.  You have redness around your IV site that gets worse. Get help right away if:  You have difficulty breathing.  You have chest pain.  You have blood in your urine or stool, or you vomit blood. Summary  After the procedure, it is common to have a sore throat or nausea. It is also common to feel tired.  Have a responsible adult stay with you for the first 24 hours after general anesthesia. It is important to have someone help care for you until you are awake and alert.  When you feel hungry, start by eating small amounts of foods that are soft and easy to digest (bland), such as toast. Gradually return to your regular diet.  Drink enough fluid to keep your urine pale yellow.  Return to your normal activities as told by your health care provider. Ask your health care provider what activities are safe for you. This information is not   intended to replace advice given to you by your health care provider. Make sure you discuss any questions you have with your health care provider. Document Revised: 06/20/2017 Document Reviewed: 01/31/2017 Elsevier Patient Education  2020 Elsevier Inc.  

## 2020-01-07 ENCOUNTER — Other Ambulatory Visit: Payer: Self-pay

## 2020-01-07 ENCOUNTER — Encounter
Admission: RE | Disposition: A | Discharge status: Home / Self Care | Payer: Self-pay | Source: Home / Self Care | Attending: Gastroenterology

## 2020-01-07 ENCOUNTER — Ambulatory Visit: Payer: Commercial Managed Care - PPO | Admitting: Anesthesiology

## 2020-01-07 ENCOUNTER — Other Ambulatory Visit: Payer: Self-pay | Admitting: Gastroenterology

## 2020-01-07 ENCOUNTER — Ambulatory Visit
Admission: RE | Admit: 2020-01-07 | Discharge: 2020-01-07 | Disposition: A | Discharge status: Home / Self Care | Payer: Commercial Managed Care - PPO | Attending: Gastroenterology | Admitting: Gastroenterology

## 2020-01-07 ENCOUNTER — Encounter: Payer: Self-pay | Admitting: Gastroenterology

## 2020-01-07 DIAGNOSIS — Z1211 Encounter for screening for malignant neoplasm of colon: Secondary | ICD-10-CM | POA: Insufficient documentation

## 2020-01-07 DIAGNOSIS — Z800 Family history of malignant neoplasm of digestive organs: Secondary | ICD-10-CM | POA: Insufficient documentation

## 2020-01-07 DIAGNOSIS — Z880 Allergy status to penicillin: Secondary | ICD-10-CM | POA: Insufficient documentation

## 2020-01-07 DIAGNOSIS — F17210 Nicotine dependence, cigarettes, uncomplicated: Secondary | ICD-10-CM | POA: Insufficient documentation

## 2020-01-07 DIAGNOSIS — K635 Polyp of colon: Secondary | ICD-10-CM

## 2020-01-07 DIAGNOSIS — Z79899 Other long term (current) drug therapy: Secondary | ICD-10-CM | POA: Diagnosis not present

## 2020-01-07 DIAGNOSIS — M1990 Unspecified osteoarthritis, unspecified site: Secondary | ICD-10-CM | POA: Diagnosis not present

## 2020-01-07 HISTORY — PX: COLONOSCOPY WITH PROPOFOL: SHX5780

## 2020-01-07 HISTORY — DX: Raynaud's syndrome without gangrene: I73.00

## 2020-01-07 HISTORY — PX: POLYPECTOMY: SHX5525

## 2020-01-07 SURGERY — COLONOSCOPY WITH PROPOFOL
Anesthesia: General | Site: Rectum

## 2020-01-07 MED ORDER — LACTATED RINGERS IV SOLN: INTRAVENOUS | Status: DC

## 2020-01-07 MED ORDER — PROPOFOL 10 MG/ML IV BOLUS
INTRAVENOUS | Status: DC | PRN
  Administered 2020-01-07 (×2): 20 mg via INTRAVENOUS
  Administered 2020-01-07: 80 mg via INTRAVENOUS
  Administered 2020-01-07 (×6): 20 mg via INTRAVENOUS

## 2020-01-07 MED ORDER — STERILE WATER FOR IRRIGATION IR SOLN
Status: DC | PRN
  Administered 2020-01-07: .05 mL

## 2020-01-07 SURGICAL SUPPLY — 12 items
CLIP HMST 235XBRD CATH ROT (MISCELLANEOUS) ×2 IMPLANT
CLIP RESOLUTION 360 11X235 (MISCELLANEOUS) ×2
ELECT REM PT RETURN 9FT ADLT (ELECTROSURGICAL) ×4
ELECTRODE REM PT RTRN 9FT ADLT (ELECTROSURGICAL) ×2 IMPLANT
FORCEPS BIOP RAD 4 LRG CAP 4 (CUTTING FORCEPS) ×4 IMPLANT
GOWN CVR UNV OPN BCK APRN NK (MISCELLANEOUS) ×4 IMPLANT
GOWN ISOL THUMB LOOP REG UNIV (MISCELLANEOUS) ×4
KIT ENDO PROCEDURE OLY (KITS) ×4 IMPLANT
MANIFOLD NEPTUNE II (INSTRUMENTS) ×4 IMPLANT
SNARE SHORT THROW 13M SML OVAL (MISCELLANEOUS) ×4 IMPLANT
TRAP ETRAP POLY (MISCELLANEOUS) ×4 IMPLANT
WATER STERILE IRR 250ML POUR (IV SOLUTION) ×4 IMPLANT

## 2020-01-07 NOTE — Transfer of Care (Signed)
Immediate Anesthesia Transfer of Care Note  Patient: Rodney Higgins  Procedure(s) Performed: COLONOSCOPY WITH PROPOFOL (N/A Rectum) POLYPECTOMY (Rectum)  Patient Location: PACU  Anesthesia Type: General  Level of Consciousness: awake, alert  and patient cooperative  Airway and Oxygen Therapy: Patient Spontanous Breathing and Patient connected to supplemental oxygen  Post-op Assessment: Post-op Vital signs reviewed, Patient's Cardiovascular Status Stable, Respiratory Function Stable, Patent Airway and No signs of Nausea or vomiting  Post-op Vital Signs: Reviewed and stable  Complications: No complications documented.

## 2020-01-07 NOTE — Anesthesia Postprocedure Evaluation (Signed)
Anesthesia Post Note  Patient: Rodney Higgins  Procedure(s) Performed: COLONOSCOPY WITH PROPOFOL (N/A Rectum) POLYPECTOMY (Rectum)     Anesthesia Post Evaluation No complications documented.  Jaidy Cottam [employer]

## 2020-01-07 NOTE — H&P (Signed)
Midge Minium, MD [facility] [address], [address] [place], [place] [address] [phone] Fax : [phone]  Primary Care Physician:  Marjie Skiff, NP Primary Gastroenterologist:  Dr. Servando Snare  Pre-Procedure History & Physical: HPI:  Rodney Higgins is a 44 y.o. male is here for an colonoscopy.   Past Medical History:  Diagnosis Date  . Allergy   . Arthritis   . Heart murmur    diagnosed in 5th to 6th grade -- no echo -- has had EKG  . Rash   . Raynaud's syndrome     Past Surgical History:  Procedure Laterality Date  . TONSILECTOMY, ADENOIDECTOMY, BILATERAL MYRINGOTOMY AND TUBES     age 68    Prior to Admission medications   Medication Sig Start Date End Date Taking? Authorizing Provider  amLODipine (NORVASC) 5 MG tablet Take 1 tablet (5 mg total) by mouth daily. Patient taking differently: Take 5 mg by mouth daily. Patient states that he takes it every other day, it causes a rash 10/19/19  Yes Cannady, Jolene T, NP  cetirizine (ZYRTEC) 10 MG tablet Take 10 mg by mouth daily. 08/26/19  Yes [provider]  predniSONE (DELTASONE) 10 MG tablet Take 6 tablets by mouth daily for 2 days, then reduce by 1 tablet every 2 days until gone 11/16/19  Yes Cannady, Jolene T, NP  Sod Picosulfate-Mag Ox-Cit Acd (CLENPIQ) 10-3.5-12 MG-GM -GM/160ML SOLN Take 320 mLs by mouth daily. 01/05/20  Yes Midge Minium, MD  valACYclovir (VALTREX) 1000 MG tablet Take 1 tablet (1,000 mg total) by mouth daily. 11/16/19  Yes Cannady, Jolene T, NP  triamcinolone cream (KENALOG) 0.1 % APPLY A THIN LAYER TO AFFECTED AREAS TWICE DAILY AS NEEDED 09/17/19   [provider]    Allergies as of 11/25/2019 - Review Complete 11/25/2019  Allergen Reaction Noted  . Penicillins Hives 03/28/2016    Family History  Problem Relation Age of Onset  . Hypertension Mother   . Lung cancer Father 35  . Prostate cancer Paternal Grandfather   . Heart attack Paternal Grandfather   . Diabetes Maternal Uncle     . Cancer Paternal Uncle   . Stroke Maternal Grandmother   . Heart attack Maternal Grandmother   . Cancer Maternal Grandfather     Social History   Socioeconomic History  . Marital status: Single    Spouse name: Not on file  . Number of children: Not on file  . Years of education: Not on file  . Highest education level: Not on file  Occupational History  . Not on file  Tobacco Use  . Smoking status: Current Every Day Smoker    Packs/day: 0.25    Years: 29.00    Pack years: 7.25    Types: Cigarettes  . Smokeless tobacco: Never Used  . Tobacco comment: since age 30  Vaping Use  . Vaping Use: Never used  Substance and Sexual Activity  . Alcohol use: Yes    Alcohol/week: 11.0 standard drinks    Types: 2 Glasses of wine, 6 Cans of beer, 3 Shots of liquor per week    Comment: does not drink Mon through Friday -- only weekends  . Drug use: Not Currently    Types: Marijuana  . Sexual activity: Yes  Other Topics Concern  . Not on file  Social History Narrative  . Not on file   Social Determinants of Health   Financial Resource Strain: Low Risk   . Difficulty of Paying Living Expenses: Not  hard at all  Food Insecurity: No Food Insecurity  . Worried About Programme researcher, broadcasting/film/video in the Last Year: Never true  . Ran Out of Food in the Last Year: Never true  Transportation Needs: No Transportation Needs  . Lack of Transportation (Medical): No  . Lack of Transportation (Non-Medical): No  Physical Activity: Insufficiently Active  . Days of Exercise per Week: 3 days  . Minutes of Exercise per Session: 30 min  Stress: No Stress Concern Present  . Feeling of Stress : Only a little  Social Connections: Unknown  . Frequency of Communication with Friends and Family: Three times a week  . Frequency of Social Gatherings with Friends and Family: Three times a week  . Attends Religious Services: Never  . Active Member of Clubs or Organizations: No  . Attends Banker  Meetings: Never  . Marital Status: Not on file  Intimate Partner Violence:   . Fear of Current or Ex-Partner:   . Emotionally Abused:   Marland Kitchen Physically Abused:   . Sexually Abused:     Review of Systems: See HPI, otherwise negative ROS  Physical Exam: BP 111/74   Pulse 66   Temp (!) 97.3 F (36.3 C) (Temporal)   Resp 18   Ht 5' 6.81" (1.697 m)   Wt 74.8 kg   SpO2 100%   BMI 25.99 kg/m  General:   Alert,  pleasant and cooperative in NAD Head:  Normocephalic and atraumatic. Neck:  Supple; no masses or thyromegaly. Lungs:  Clear throughout to auscultation.    Heart:  Regular rate and rhythm. Abdomen:  Soft, nontender and nondistended. Normal bowel sounds, without guarding, and without rebound.   Neurologic:  Alert and  oriented x4;  grossly normal neurologically.  Impression/Plan: Rodney Higgins is here for an colonoscopy to be performed for family history of colon cancer  Risks, benefits, limitations, and alternatives regarding  colonoscopy have been reviewed with the patient.  Questions have been answered.  All parties agreeable.   Midge Minium, MD  01/07/2020, 7:41 AM

## 2020-01-07 NOTE — Anesthesia Procedure Notes (Signed)
Performed by: Alena Blankenbeckler, CRNA Pre-anesthesia Checklist: Patient identified, Emergency Drugs available, Suction available, Timeout performed and Patient being monitored Patient Re-evaluated:Patient Re-evaluated prior to induction Oxygen Delivery Method: Nasal cannula Placement Confirmation: positive ETCO2       

## 2020-01-07 NOTE — Op Note (Signed)
[facility] Gastroenterology Patient Name: Rodney Higgins Procedure Date: 01/07/2020 8:20 AM MRN: [id number] Account #: [id number] Date of Birth: June 11, 1976 Admit Type: Outpatient Age: 44 Room: [facility] OR ROOM 01 Gender: Male Note Status: Finalized Procedure:             Colonoscopy Indications:           Family history of colon cancer in a first-degree                         relative. Providers:             Midge Minium MD, MD Referring MD:          Steele Sizer, MD (Referring MD) Medicines:             Propofol per Anesthesia Complications:         No immediate complications. Procedure:             Pre-Anesthesia Assessment:                        - Prior to the procedure, a History and Physical was                         performed, and patient medications and allergies were                         reviewed. The patient's tolerance of previous                         anesthesia was also reviewed. The risks and benefits                         of the procedure and the sedation options and risks                         were discussed with the patient. All questions were                         answered, and informed consent was obtained. Prior                         Anticoagulants: The patient has taken no previous                         anticoagulant or antiplatelet agents. ASA Grade                         Assessment: II - A patient with mild systemic disease.                         After reviewing the risks and benefits, the patient                         was deemed in satisfactory condition to undergo the                         procedure.  After obtaining informed consent, the colonoscope was                         passed under direct vision. Throughout the procedure,                         the patient's blood pressure, pulse, and oxygen                         saturations were monitored continuously. The                          Colonoscope was introduced through the anus and                         advanced to the the cecum, identified by appendiceal                         orifice and ileocecal valve. The colonoscopy was                         performed without difficulty. The patient tolerated                         the procedure well. The quality of the bowel                         preparation was excellent. Findings:      The perianal and digital rectal examinations were normal.      A 10 mm polyp was found in the sigmoid colon. The polyp was       pedunculated. The polyp was removed with a hot snare. Resection and       retrieval were complete. To prevent bleeding post-intervention, one       hemostatic clip was successfully placed (MR conditional). There was no       bleeding at the end of the procedure.      A 2 mm polyp was found in the ascending colon. The polyp was sessile.       The polyp was removed with a cold biopsy forceps. Resection and       retrieval were complete. Impression:            - One 10 mm polyp in the sigmoid colon, removed with a                         hot snare. Resected and retrieved. Clip (MR                         conditional) was placed.                        - One 2 mm polyp in the ascending colon, removed with                         a cold biopsy forceps. Resected and retrieved. Recommendation:        - Discharge patient to home.                        -  Resume previous diet.                        - Continue present medications.                        - Await pathology results.                        - Repeat colonoscopy in 3 years for surveillance. Procedure Code(s):     --- Professional ---                        [phone], Colonoscopy, flexible; with removal of                         tumor(s), polyp(s), or other lesion(s) by snare                         technique                        [id number], 59, Colonoscopy, flexible; with biopsy, single                          or multiple Diagnosis Code(s):     --- Professional ---                        Z80.0, Family history of malignant neoplasm of                         digestive organs                        K63.5, Polyp of colon CPT copyright 2019 American Medical Association. All rights reserved. The codes documented in this report are preliminary and upon coder review may  be revised to meet current compliance requirements. Midge Minium MD, MD 01/07/2020 8:51:57 AM This report has been signed electronically. Number of Addenda: 0 Note Initiated On: 01/07/2020 8:20 AM Scope Withdrawal Time: 0 hours 13 minutes 47 seconds  Total Procedure Duration: 0 hours 16 minutes 44 seconds  Estimated Blood Loss:  Estimated blood loss: none.      [facility]

## 2020-01-07 NOTE — Anesthesia Preprocedure Evaluation (Addendum)
Anesthesia Evaluation  Patient identified by MRN, date of birth, ID band Patient awake    Reviewed: Allergy & Precautions, NPO status , Patient's Chart, lab work & pertinent test results  Airway Mallampati: II  TM Distance: >3 FB Neck ROM: Full    Dental no notable dental hx.    Pulmonary Current Smoker and Patient abstained from smoking.,    Pulmonary exam normal        Cardiovascular negative cardio ROS Normal cardiovascular exam     Neuro/Psych negative neurological ROS  negative psych ROS   GI/Hepatic negative GI ROS, Neg liver ROS,   Endo/Other  negative endocrine ROS  Renal/GU negative Renal ROS     Musculoskeletal  (+) Arthritis ,   Abdominal Normal abdominal exam  (+)   Peds  Hematology negative hematology ROS (+)   Anesthesia Other Findings   Reproductive/Obstetrics                             Anesthesia Physical Anesthesia Plan  ASA: II  Anesthesia Plan: General   Post-op Pain Management:    Induction: Intravenous  PONV Risk Score and Plan: 1 and Propofol infusion, TIVA and Treatment may vary due to age or medical condition  Airway Management Planned: Natural Airway  Additional Equipment:   Intra-op Plan:   Post-operative Plan:   Informed Consent: I have reviewed the patients History and Physical, chart, labs and discussed the procedure including the risks, benefits and alternatives for the proposed anesthesia with the patient or authorized representative who has indicated his/her understanding and acceptance.     Dental advisory given  Plan Discussed with: CRNA  Anesthesia Plan Comments:         Anesthesia Quick Evaluation

## 2020-01-10 ENCOUNTER — Encounter: Payer: Self-pay | Admitting: Gastroenterology

## 2020-01-10 LAB — SURGICAL PATHOLOGY

## 2020-01-11 LAB — PATHOLOGY

## 2020-04-24 ENCOUNTER — Ambulatory Visit: Payer: Self-pay

## 2020-04-24 NOTE — Telephone Encounter (Signed)
Pt. Reports he has been coughing "for months." Dry cough. Reports no OTC helps. Coughing so much "I'm sore." Tested negative for COVID 19 last week at work. Has "very mild dizziness at times." Requests visit with [facility] only. No availability until 05/01/20. Please advise pt. Reason for Disposition  [1] Continuous (nonstop) coughing interferes with work or school AND [2] no improvement using cough treatment per protocol  Answer Assessment - Initial Assessment Questions 1. ONSET: "When did the cough begin?"      2 months 2. SEVERITY: "How bad is the cough today?"      Severe 3. SPUTUM: "Describe the color of your sputum" (none, dry cough; clear, white, yellow, green)     n/a 4. HEMOPTYSIS: "Are you coughing up any blood?" If so ask: "How much?" (flecks, streaks, tablespoons, etc.)     No 5. DIFFICULTY BREATHING: "Are you having difficulty breathing?" If Yes, ask: "How bad is it?" (e.g., mild, moderate, severe)    - MILD: No SOB at rest, mild SOB with walking, speaks normally in sentences, can lay down, no retractions, pulse < 100.    - MODERATE: SOB at rest, SOB with minimal exertion and prefers to sit, cannot lie down flat, speaks in phrases, mild retractions, audible wheezing, pulse 100-120.    - SEVERE: Very SOB at rest, speaks in single words, struggling to breathe, sitting hunched forward, retractions, pulse > 120      No 6. FEVER: "Do you have a fever?" If Yes, ask: "What is your temperature, how was it measured, and when did it start?"     No 7. CARDIAC HISTORY: "Do you have any history of heart disease?" (e.g., heart attack, congestive heart failure)      Murmur 8. LUNG HISTORY: "Do you have any history of lung disease?"  (e.g., pulmonary embolus, asthma, emphysema)     No 9. PE RISK FACTORS: "Do you have a history of blood clots?" (or: recent major surgery, recent prolonged travel, bedridden)     No 10. OTHER SYMPTOMS: "Do you have any other symptoms?" (e.g., runny nose,  wheezing, chest pain)       Fatigue 11. PREGNANCY: "Is there any chance you are pregnant?" "When was your last menstrual period?"       n/a 12. TRAVEL: "Have you traveled out of the country in the last month?" (e.g., travel history, exposures)       No  Protocols used: COUGH - ACUTE NON-PRODUCTIVE-A-AH

## 2020-04-24 NOTE — Telephone Encounter (Signed)
Please see if can schedule visit for 05/01/20 so can further assess in office.  Thank you.

## 2020-04-24 NOTE — Telephone Encounter (Signed)
Pt scheduled , verbalized understanding.  

## 2020-05-01 ENCOUNTER — Other Ambulatory Visit: Payer: Self-pay

## 2020-05-01 ENCOUNTER — Encounter: Payer: Self-pay | Admitting: Nurse Practitioner

## 2020-05-01 ENCOUNTER — Ambulatory Visit: Payer: Commercial Managed Care - PPO | Admitting: Nurse Practitioner

## 2020-05-01 VITALS — BP 112/78 | HR 83 | Temp 98.2°F | Ht 68.0 in | Wt 163.8 lb

## 2020-05-01 DIAGNOSIS — G2581 Restless legs syndrome: Secondary | ICD-10-CM

## 2020-05-01 DIAGNOSIS — R059 Cough, unspecified: Secondary | ICD-10-CM | POA: Diagnosis not present

## 2020-05-01 MED ORDER — ALBUTEROL SULFATE HFA 108 (90 BASE) MCG/ACT IN AERS
2.0000 | INHALATION_SPRAY | Freq: Four times a day (QID) | RESPIRATORY_TRACT | 0 refills | Status: DC | PRN
Start: 2020-05-01 — End: 2020-05-15

## 2020-05-01 MED ORDER — PREDNISONE 10 MG PO TABS: ORAL_TABLET | ORAL | 0 refills | Status: DC

## 2020-05-01 MED ORDER — GABAPENTIN 300 MG PO CAPS: 300.0000 mg | ORAL_CAPSULE | Freq: Every day | ORAL | 3 refills | Status: DC

## 2020-05-01 NOTE — Progress Notes (Signed)
BP 112/78   Pulse 83   Temp 98.2 F (36.8 C) (Oral)   Ht 5\' 8"  (1.727 m)   Wt 163 lb 12.8 oz (74.3 kg)   SpO2 94%   BMI 24.91 kg/m    Subjective:    Patient ID: , male    DOB: 09/24/1975, 45 y.o.   MRN: [id number]  HPI: Rodney Higgins is a 44 y.o. male  Chief Complaint  Patient presents with  . Cough    All has been going on for the past 3 months, progressively getting worse. Cough gives him dry heaves.   . no appetite  . Cant Sleep  . Fatigue   Has FMLA forms to start 04/20/20 -- he will bring these in for provider to sign.  COUGH Reports has had cough for 3 months -- tested negative for Covid at time (his family was all positive, but he was negative) and was given Zpack when initially presented.  Starting to cause dry heaves occasionally when coughing spells.  Starting to have overall body aches from coughing so much. Is a current smoker, smokes 4 cigarettes a day.  Has lost 5 pounds since June.  Endorses decreased appetite which started with cough + occasional night sweats.  Denies fever.  No history of asthma.  Denies edema or SOB with non strenuous activity. Duration: months Circumstances of initial development of cough: URI Cough severity: moderate Cough description: non-productive, dry and irritating Aggravating factors:  worse in the AM Alleviating factors: mucinex and cough syrup Status:  worse Treatments attempted: mucinex and cough syrup Wheezing: yes Shortness of breath: yes Chest pain: no Chest tightness:yes Nasal congestion: occasional Runny nose: no Postnasal drip: no Frequent throat clearing or swallowing: yes Hemoptysis: no Fevers: no Night sweats: a little bit Weight loss: yes Heartburn: no Recent foreign travel: no Tuberculosis contacts: no   RESTLESS LEGS Has had this ongoing for > 6 months, prior to cough, jumps from leg to leg.  This causes fatigue during daytime hours because he can not sleep at night.   Duration:  chronic Discomfort description:  ill-defined Pain: yes Location: lower legs Bilateral: yes Symmetric: yes Severity: 7/10 Onset:  gradual Frequency:  constant Symptoms only occur while legs at rest: yes Sudden unintentional leg jerking: no Bed partner bothered by leg movements: yes LE numbness: no Decreased sensation: no Weakness: no Insomnia: yes Daytime somnolence: yes Fatigue: yes Alleviating factors:  Aggravating factors:  Status: worse Treatments attempted: Ibuprofen  Relevant past medical, surgical, family and social history reviewed and updated as indicated. Interim medical history since our last visit reviewed. Allergies and medications reviewed and updated.  Review of Systems  Constitutional: Positive for appetite change, diaphoresis (at night sometimes), fatigue and unexpected weight change. Negative for activity change and fever.  HENT: Positive for congestion. Negative for postnasal drip, rhinorrhea, sinus pressure, sinus pain, sore throat and voice change.   Respiratory: Positive for cough, chest tightness, shortness of breath and wheezing.   Cardiovascular: Negative for chest pain, palpitations and leg swelling.  Gastrointestinal: Negative.   Endocrine: Negative for cold intolerance and heat intolerance.  Musculoskeletal: Positive for arthralgias.  Neurological: Positive for dizziness (occasional). Negative for tremors, syncope, speech difficulty, weakness, light-headedness and headaches.  Psychiatric/Behavioral: Negative.     Per HPI unless specifically indicated above     Objective:    BP 112/78   Pulse 83   Temp 98.2 F (36.8 C) (Oral)   Ht 5\' 8"  (1.727 m)  Wt 163 lb 12.8 oz (74.3 kg)   SpO2 94%   BMI 24.91 kg/m   Wt Readings from Last 3 Encounters:  05/01/20 163 lb 12.8 oz (74.3 kg)  01/07/20 165 lb (74.8 kg)  12/28/19 168 lb 3.2 oz (76.3 kg)    Physical Exam Vitals and nursing note reviewed.  Constitutional:      General: He is awake. He  is not in acute distress.    Appearance: He is well-developed and well-groomed. He is not ill-appearing.  HENT:     Head: Normocephalic and atraumatic.     Right Ear: Hearing normal. No drainage.     Left Ear: Hearing normal. No drainage.  Eyes:     General: Lids are normal.        Right eye: No discharge.        Left eye: No discharge.     Conjunctiva/sclera: Conjunctivae normal.     Pupils: Pupils are equal, round, and reactive to light.  Neck:     Thyroid: No thyromegaly.     Vascular: No carotid bruit.     Trachea: Trachea normal.  Cardiovascular:     Rate and Rhythm: Normal rate and regular rhythm.     Pulses:          Popliteal pulses are 2+ on the right side and 2+ on the left side.       Dorsalis pedis pulses are 2+ on the right side and 2+ on the left side.     Heart sounds: S1 normal and S2 normal. Murmur heard.  Systolic murmur is present with a grade of 1/6.  No gallop.   Pulmonary:     Effort: Pulmonary effort is normal. No accessory muscle usage or respiratory distress.     Breath sounds: Wheezing present.     Comments: Intermittent expiratory wheezes notes throughout on exam, no cough noted until deep breathing with auscultation with stimulated cough -- non productive.  No SOB with talking. Abdominal:     General: Bowel sounds are normal.     Palpations: Abdomen is soft. There is no hepatomegaly or splenomegaly.  Musculoskeletal:        General: Normal range of motion.     Cervical back: Normal range of motion and neck supple.     Right lower leg: No edema.     Left lower leg: No edema.  Lymphadenopathy:     Head:     Right side of head: No submental, submandibular, tonsillar, preauricular or posterior auricular adenopathy.     Left side of head: No submental, submandibular, tonsillar, preauricular or posterior auricular adenopathy.     Cervical: No cervical adenopathy.     Right cervical: No superficial cervical adenopathy.    Left cervical: No superficial  cervical adenopathy.     Upper Body:     Right upper body: No supraclavicular adenopathy.     Left upper body: No supraclavicular adenopathy.  Skin:    General: Skin is warm and dry.     Capillary Refill: Capillary refill takes less than 2 seconds.     Findings: No rash.  Neurological:     Mental Status: He is alert and oriented to person, place, and time.     Deep Tendon Reflexes: Reflexes are normal and symmetric.  Psychiatric:        Attention and Perception: Attention normal.        Mood and Affect: Mood normal.        Speech: Speech  normal.        Behavior: Behavior normal. Behavior is cooperative.        Thought Content: Thought content normal.    Results for orders placed or performed in visit on 01/07/20  Pathology  Result Value Ref Range   . Comment    . Comment    . Comment    . Comment    . Comment:    . Comment    . Comment    . Comment    . Comment       Assessment & Plan:   Problem List Items Addressed This Visit      Other   Restless leg syndrome    Ongoing for > 6 months with no improvement.  Will trial Gabapentin 300 MG at night which may benefit RLS and sleep pattern.  Labs today CBC, Mag level, CMP, TSH.  Will assess for abnormalities and address if present.  Recommend Tylenol at night prior to bed as needed.  Low suspicion for OSA.  If ongoing consider sleep testing.  Return in 2 weeks.      Relevant Orders   TSH   Magnesium   Cough - Primary    Persistent cough x 3 months, starting after URI (tested negative on Covid, but whole family had Covid at time -- ?Covid will check antibodies on labs).  Currently does have 5 pound weight loss since July and decreased appetite + reports occasional night sweats.  Is a smoker.  Will obtain labs today to include Covid IGG, CBC, CMP, TSH, and Quantiferon Gold.  Will obtain chest CT due to current symptoms and risk factors.  Discussed with patient at length plan.  Scripts for Prednisone and Albuterol sent, to  assess if benefit.  Return in 2 weeks for follow-up.      Relevant Orders   CBC with Differential/Platelet   Comprehensive metabolic panel   QuantiFERON-TB Gold Plus   CT Chest Wo Contrast   SAR CoV2 Serology (COVID 19)AB(IGG)IA       Follow up plan: Return in about 2 weeks (around 05/15/2020) for Cough.

## 2020-05-01 NOTE — Patient Instructions (Signed)

## 2020-05-01 NOTE — Assessment & Plan Note (Signed)
Persistent cough x 3 months, starting after URI (tested negative on Covid, but whole family had Covid at time -- ?Covid will check antibodies on labs).  Currently does have 5 pound weight loss since July and decreased appetite + reports occasional night sweats.  Is a smoker.  Will obtain labs today to include Covid IGG, CBC, CMP, TSH, and Quantiferon Gold.  Will obtain chest CT due to current symptoms and risk factors.  Discussed with patient at length plan.  Scripts for Prednisone and Albuterol sent, to assess if benefit.  Return in 2 weeks for follow-up.

## 2020-05-01 NOTE — Assessment & Plan Note (Signed)
Ongoing for > 6 months with no improvement.  Will trial Gabapentin 300 MG at night which may benefit RLS and sleep pattern.  Labs today CBC, Mag level, CMP, TSH.  Will assess for abnormalities and address if present.  Recommend Tylenol at night prior to bed as needed.  Low suspicion for OSA.  If ongoing consider sleep testing.  Return in 2 weeks.

## 2020-05-02 LAB — SAR COV2 SEROLOGY (COVID19)AB(IGG),IA: DiaSorin SARS-CoV-2 Ab, IgG: NEGATIVE

## 2020-05-02 NOTE — Progress Notes (Signed)
Contacted via MyChart  Good afternoon Rodney Higgins, most of your labs have returned with exception of TB testing.  I even looked at Covid antibodies and these returned negative, so suspect no history of Covid.  I checked this due to your history and family members being sick.  There is no elevation in white blood cell count, which is good.  Thyroid and magnesium level + kidney function are good.  The only concerning element is your liver function tests, ALT and AST, are quite elevated from previous check.  Previously there were 22 and 25, now they are 128 and 112.  This is big change for you.  Have you been drinking any alcohol or taking Tylenol regularly?  If so how often and how much?  Let me know.  I would recommend cutting back on both alcohol and Tylenol use if present and will recheck these next visit. Let me know if you do not hear to schedule CT scan.  Any questions? Keep being awesome!!  Thank you for allowing me to participate in your care. Kindest regards, Rodney Higgins

## 2020-05-03 LAB — CBC WITH DIFFERENTIAL/PLATELET
Basophils Absolute: 0.1 10*3/uL (ref 0.0–0.2)
Basos: 2 %
EOS (ABSOLUTE): 0.1 10*3/uL (ref 0.0–0.4)
Eos: 2 %
Hematocrit: 46.5 % (ref 37.5–51.0)
Hemoglobin: 15.6 g/dL (ref 13.0–17.7)
Immature Grans (Abs): 0 10*3/uL (ref 0.0–0.1)
Immature Granulocytes: 0 %
Lymphocytes Absolute: 1.4 10*3/uL (ref 0.7–3.1)
Lymphs: 32 %
MCH: 31.5 pg (ref 26.6–33.0)
MCHC: 33.5 g/dL (ref 31.5–35.7)
MCV: 94 fL (ref 79–97)
Monocytes Absolute: 0.7 10*3/uL (ref 0.1–0.9)
Monocytes: 15 %
Neutrophils Absolute: 2.2 10*3/uL (ref 1.4–7.0)
Neutrophils: 49 %
Platelets: 262 10*3/uL (ref 150–450)
RBC: 4.96 x10E6/uL (ref 4.14–5.80)
RDW: 13.1 % (ref 11.6–15.4)
WBC: 4.5 10*3/uL (ref 3.4–10.8)

## 2020-05-03 LAB — QUANTIFERON-TB GOLD PLUS
QuantiFERON Mitogen Value: 9.25 IU/mL
QuantiFERON Nil Value: 0 IU/mL
QuantiFERON TB1 Ag Value: 0.01 IU/mL
QuantiFERON TB2 Ag Value: 0.01 IU/mL
QuantiFERON-TB Gold Plus: NEGATIVE

## 2020-05-03 LAB — COMPREHENSIVE METABOLIC PANEL
ALT: 128 IU/L — ABNORMAL HIGH (ref 0–44)
AST: 112 IU/L — ABNORMAL HIGH (ref 0–40)
Albumin/Globulin Ratio: 1.8 (ref 1.2–2.2)
Albumin: 4.7 g/dL (ref 4.0–5.0)
Alkaline Phosphatase: 89 IU/L (ref 44–121)
BUN/Creatinine Ratio: 9 (ref 9–20)
BUN: 10 mg/dL (ref 6–24)
Bilirubin Total: 0.8 mg/dL (ref 0.0–1.2)
CO2: 24 mmol/L (ref 20–29)
Calcium: 9.6 mg/dL (ref 8.7–10.2)
Chloride: 98 mmol/L (ref 96–106)
Creatinine, Ser: 1.12 mg/dL (ref 0.76–1.27)
GFR calc Af Amer: 92 mL/min/{1.73_m2} (ref >59)
GFR calc non Af Amer: 79 mL/min/{1.73_m2} (ref >59)
Globulin, Total: 2.6 g/dL (ref 1.5–4.5)
Glucose: 87 mg/dL (ref 65–99)
Potassium: 4.5 mmol/L (ref 3.5–5.2)
Sodium: 135 mmol/L (ref 134–144)
Total Protein: 7.3 g/dL (ref 6.0–8.5)

## 2020-05-03 LAB — MAGNESIUM: Magnesium: 2.2 mg/dL (ref 1.6–2.3)

## 2020-05-03 LAB — TSH: TSH: 1.14 u[IU]/mL (ref 0.450–4.500)

## 2020-05-03 NOTE — Addendum Note (Signed)
Addended by: Aura Dials T on: 05/03/2020 07:54 PM   Modules accepted: Orders

## 2020-05-03 NOTE — Progress Notes (Signed)
Contacted via MyChart  TB testing returned negative.  Did you receive my last result note?  Please let me know?

## 2020-05-08 ENCOUNTER — Ambulatory Visit
Admission: RE | Admit: 2020-05-08 | Discharge: 2020-05-08 | Disposition: A | Discharge status: Home / Self Care | Payer: Commercial Managed Care - PPO | Source: Ambulatory Visit | Attending: Nurse Practitioner | Admitting: Nurse Practitioner

## 2020-05-08 ENCOUNTER — Other Ambulatory Visit: Payer: Self-pay

## 2020-05-08 DIAGNOSIS — R059 Cough, unspecified: Secondary | ICD-10-CM | POA: Diagnosis present

## 2020-05-08 NOTE — Progress Notes (Signed)
Contacted via MyChart  Good afternoon Rodney Higgins, so overall your chest CT appears benign.  No concerns for cancer noted.  You do have a small 5 right middle lobe nodule, which they report no follow-up needed for -- however due to your smoking we could consider referring you to nodule clinic to monitor this yearly.  This is a clinic at hospital that will call you yearly and get you in for CT to reassess.  Looks like you have some mild inflammation in the tubes of the lungs, this can be related to smoking.  Is Prednisone helping cough?  No pneumonia or concerning findings overall, which is good news. Keep being awesome!!  Thank you for allowing me to participate in your care. Kindest regards, Mister Krahenbuhl

## 2020-05-09 ENCOUNTER — Telehealth: Payer: Self-pay

## 2020-05-09 NOTE — Telephone Encounter (Signed)
Please let me know when it is in my folder and I will complete for him.  Thank you.  Did not see it in folder yet.

## 2020-05-09 NOTE — Telephone Encounter (Signed)
FMLA paperwork placed in the Incomplete Paperwork file

## 2020-05-09 NOTE — Telephone Encounter (Signed)
Pt came by to drop off FMLA paperwork that needs to be filled out, put in the back. Contact pt with any questions.

## 2020-05-10 NOTE — Telephone Encounter (Signed)
Paperwork in providers folder for completion and signature.

## 2020-05-15 ENCOUNTER — Ambulatory Visit: Payer: Commercial Managed Care - PPO | Admitting: Nurse Practitioner

## 2020-05-15 ENCOUNTER — Other Ambulatory Visit: Payer: Self-pay

## 2020-05-15 ENCOUNTER — Encounter: Payer: Self-pay | Admitting: Nurse Practitioner

## 2020-05-15 VITALS — BP 131/79 | HR 94 | Temp 98.7°F | Wt 170.6 lb

## 2020-05-15 DIAGNOSIS — R7989 Other specified abnormal findings of blood chemistry: Secondary | ICD-10-CM

## 2020-05-15 DIAGNOSIS — R059 Cough, unspecified: Secondary | ICD-10-CM | POA: Diagnosis not present

## 2020-05-15 DIAGNOSIS — F17210 Nicotine dependence, cigarettes, uncomplicated: Secondary | ICD-10-CM

## 2020-05-15 DIAGNOSIS — G2581 Restless legs syndrome: Secondary | ICD-10-CM

## 2020-05-15 DIAGNOSIS — R011 Cardiac murmur, unspecified: Secondary | ICD-10-CM | POA: Diagnosis not present

## 2020-05-15 MED ORDER — GABAPENTIN 300 MG PO CAPS: 600.0000 mg | ORAL_CAPSULE | Freq: Every day | ORAL | 4 refills | Status: AC

## 2020-05-15 MED ORDER — ALBUTEROL SULFATE HFA 108 (90 BASE) MCG/ACT IN AERS: 2.0000 | INHALATION_SPRAY | Freq: Four times a day (QID) | RESPIRATORY_TRACT | 2 refills | Status: DC | PRN

## 2020-05-15 NOTE — Assessment & Plan Note (Addendum)
Ongoing and improved with Gabapentin.  Will continue Gabapentin 600 MG QHS and adjust as needed.  He has cut back on alcohol use to help ease pain.  Refills sent in.  Return in 2 months.

## 2020-05-15 NOTE — Assessment & Plan Note (Signed)
Ongoing for years, no previous echo and asymptomatic.  Could consider echo if any symptoms present or worsening murmur.

## 2020-05-15 NOTE — Telephone Encounter (Signed)
Paperwork signed and faxed in for patient. Copy picked up today at patient's appointment.

## 2020-05-15 NOTE — Progress Notes (Signed)
BP 131/79   Pulse 94   Temp 98.7 F (37.1 C)   Wt 170 lb 9.6 oz (77.4 kg)   SpO2 97%   BMI 25.94 kg/m    Subjective:    Patient ID: Rodney Higgins, male    DOB: 02/18/1976, 44 y.o.   MRN: [id number]  HPI: Rodney Higgins is a 44 y.o. male  Chief Complaint  Patient presents with  . Cough    pt here to follow up on cough, pt states cough seems to be getting better. Pt had CT scan done.    COUGH Follow-up for cough, was given Prednisone last visit.  Had CT scan with no acute findings, a 5 mm right middle lobe nodule noted, no follow-up for low risk, however patient is a smoker and may benefit from repeat in 12 months. At this time he reports -- cough is improving with congestion coming up.  Is using Albuterol inhaler 2-3 times a day at this time or as needed.  Recent visit all labs WNL with exception of ALT/AST 128/112 -- had been self medicating with alcohol due to leg pain.  Last visit reported had cough for 3 months -- tested negative for Covid at time (his family was all positive, but he was negative) and was given Zpack when initially presented.  Is a current smoker, smokes 4 cigarettes a day. Has gained weight at this time -- 170 lbs, previously had lost weight.  Denies fever.  No history of asthma.  Denies edema or SOB with non strenuous activity.  No history of childhood asthma. Duration: months Circumstances of initial development of cough: URI Cough severity: moderate Cough description: non-productive, dry and irritating Aggravating factors:  worse in the AM Alleviating factors: mucinex and cough syrup Status:  worse Treatments attempted: mucinex and cough syrup Wheezing: yes Shortness of breath: yes Chest pain: no Chest tightness:yes Nasal congestion: occasional Runny nose: no Postnasal drip: no Frequent throat clearing or swallowing: yes Hemoptysis: no Fevers: no Night sweats: a little bit Weight loss: yes Heartburn: no Recent foreign travel:  no Tuberculosis contacts: no   RESTLESS LEGS Follow-up for RLS, started on Gabapentin at night last visit -- reports this is helping and improved with 600 MG.  Has had this ongoing for > 6 months, prior to cough, jumps from leg to leg.   Duration: chronic Discomfort description:  ill-defined Pain: yes Location: lower legs Bilateral: yes Symmetric: yes Severity: 0/10 Onset:  gradual Frequency:  constant Symptoms only occur while legs at rest: yes Sudden unintentional leg jerking: no Bed partner bothered by leg movements: yes LE numbness: no Decreased sensation: no Weakness: no Insomnia: none Daytime somnolence: yes Fatigue: yes Alleviating factors:  Aggravating factors:  Status: worse Treatments attempted: Ibuprofen  Relevant past medical, surgical, family and social history reviewed and updated as indicated. Interim medical history since our last visit reviewed. Allergies and medications reviewed and updated.  Review of Systems  Constitutional: Negative for activity change, appetite change, diaphoresis, fatigue, fever and unexpected weight change.  HENT: Negative.  Negative for voice change.   Respiratory: Positive for cough. Negative for chest tightness, shortness of breath and wheezing.   Cardiovascular: Negative for chest pain, palpitations and leg swelling.  Gastrointestinal: Negative.   Musculoskeletal: Negative for arthralgias.  Neurological: Negative.   Psychiatric/Behavioral: Negative.     Per HPI unless specifically indicated above     Objective:    BP 131/79   Pulse 94   Temp 98.7 F (37.1  C)   Wt 170 lb 9.6 oz (77.4 kg)   SpO2 97%   BMI 25.94 kg/m   Wt Readings from Last 3 Encounters:  05/15/20 170 lb 9.6 oz (77.4 kg)  05/01/20 163 lb 12.8 oz (74.3 kg)  01/07/20 165 lb (74.8 kg)    Physical Exam Vitals and nursing note reviewed.  Constitutional:      General: He is awake. He is not in acute distress.    Appearance: He is well-developed and  well-groomed. He is not ill-appearing.  HENT:     Head: Normocephalic and atraumatic.     Right Ear: Hearing normal. No drainage.     Left Ear: Hearing normal. No drainage.  Eyes:     General: Lids are normal.        Right eye: No discharge.        Left eye: No discharge.     Conjunctiva/sclera: Conjunctivae normal.     Pupils: Pupils are equal, round, and reactive to light.  Neck:     Thyroid: No thyromegaly.     Vascular: No carotid bruit.     Trachea: Trachea normal.  Cardiovascular:     Rate and Rhythm: Normal rate and regular rhythm.     Heart sounds: S1 normal and S2 normal. Murmur heard.  Systolic murmur is present with a grade of 1/6.  No gallop.   Pulmonary:     Effort: Pulmonary effort is normal. No accessory muscle usage or respiratory distress.     Breath sounds: Normal breath sounds.     Comments: Clear throughout on exam and no cough noted on exam today.  No SOB with talking. Abdominal:     General: Bowel sounds are normal.     Palpations: Abdomen is soft. There is no hepatomegaly or splenomegaly.  Musculoskeletal:        General: Normal range of motion.     Cervical back: Normal range of motion and neck supple.     Right lower leg: No edema.     Left lower leg: No edema.  Lymphadenopathy:     Head:     Right side of head: No submental, submandibular, tonsillar, preauricular or posterior auricular adenopathy.     Left side of head: No submental, submandibular, tonsillar, preauricular or posterior auricular adenopathy.     Cervical: No cervical adenopathy.     Right cervical: No superficial cervical adenopathy.    Left cervical: No superficial cervical adenopathy.     Upper Body:     Right upper body: No supraclavicular adenopathy.     Left upper body: No supraclavicular adenopathy.  Skin:    General: Skin is warm and dry.     Capillary Refill: Capillary refill takes less than 2 seconds.     Findings: No rash.  Neurological:     Mental Status: He is alert  and oriented to person, place, and time.     Deep Tendon Reflexes: Reflexes are normal and symmetric.  Psychiatric:        Attention and Perception: Attention normal.        Mood and Affect: Mood normal.        Speech: Speech normal.        Behavior: Behavior normal. Behavior is cooperative.        Thought Content: Thought content normal.     Results for orders placed or performed in visit on 05/01/20  CBC with Differential/Platelet  Result Value Ref Range   WBC 4.5 3.4 -  10.8 x10E3/uL   RBC 4.96 4.14 - 5.80 x10E6/uL   Hemoglobin 15.6 13.0 - 17.7 g/dL   Hematocrit 40.9 73.5 - 51.0 %   MCV 94 79 - 97 fL   MCH 31.5 26.6 - 33.0 pg   MCHC 33.5 31 - 35 g/dL   RDW 32.9 92.4 - 26.8 %   Platelets 262 150 - 450 x10E3/uL   Neutrophils 49 Not Estab. %   Lymphs 32 Not Estab. %   Monocytes 15 Not Estab. %   Eos 2 Not Estab. %   Basos 2 Not Estab. %   Neutrophils Absolute 2.2 1.40 - 7.00 x10E3/uL   Lymphocytes Absolute 1.4 0 - 3 x10E3/uL   Monocytes Absolute 0.7 0 - 0 x10E3/uL   EOS (ABSOLUTE) 0.1 0.0 - 0.4 x10E3/uL   Basophils Absolute 0.1 0 - 0 x10E3/uL   Immature Granulocytes 0 Not Estab. %   Immature Grans (Abs) 0.0 0.0 - 0.1 x10E3/uL  Comprehensive metabolic panel  Result Value Ref Range   Glucose 87 65 - 99 mg/dL   BUN 10 6 - 24 mg/dL   Creatinine, Ser 3.41 0.76 - 1.27 mg/dL   GFR calc non Af Amer 79 >59 mL/min/1.73   GFR calc Af Amer 92 >59 mL/min/1.73   BUN/Creatinine Ratio 9 9 - 20   Sodium 135 134 - 144 mmol/L   Potassium 4.5 3.5 - 5.2 mmol/L   Chloride 98 96 - 106 mmol/L   CO2 24 20 - 29 mmol/L   Calcium 9.6 8.7 - 10.2 mg/dL   Total Protein 7.3 6.0 - 8.5 g/dL   Albumin 4.7 4.0 - 5.0 g/dL   Globulin, Total 2.6 1.5 - 4.5 g/dL   Albumin/Globulin Ratio 1.8 1.2 - 2.2   Bilirubin Total 0.8 0.0 - 1.2 mg/dL   Alkaline Phosphatase 89 44 - 121 IU/L   AST 112 (H) 0 - 40 IU/L   ALT 128 (H) 0 - 44 IU/L  TSH  Result Value Ref Range   TSH 1.140 0.450 - 4.500 uIU/mL  Magnesium   Result Value Ref Range   Magnesium 2.2 1.6 - 2.3 mg/dL  QuantiFERON-TB Gold Plus  Result Value Ref Range   QuantiFERON Incubation Incubation performed.    QuantiFERON Criteria Comment    QuantiFERON TB1 Ag Value 0.01 IU/mL   QuantiFERON TB2 Ag Value 0.01 IU/mL   QuantiFERON Nil Value 0.00 IU/mL   QuantiFERON Mitogen Value 9.25 IU/mL   QuantiFERON-TB Gold Plus Negative Negative  SAR CoV2 Serology (COVID 19)AB(IGG)IA  Result Value Ref Range   DiaSorin SARS-CoV-2 Ab, IgG Negative Negative      Assessment & Plan:   Problem List Items Addressed This Visit      Other   Nicotine dependence, cigarettes, uncomplicated    I have recommended complete cessation of tobacco use. I have discussed various options available for assistance with tobacco cessation including over the counter methods (Nicotine gum, patch and lozenges). We also discussed prescription options (Chantix, Nicotine Inhaler / Nasal Spray). The patient is not interested in pursuing any prescription tobacco cessation options at this time.       Heart murmur    Ongoing for years, no previous echo and asymptomatic.  Could consider echo if any symptoms present or worsening murmur.        Restless leg syndrome    Ongoing and improved with Gabapentin.  Will continue Gabapentin 600 MG QHS and adjust as needed.  He has cut back on alcohol use to help ease  pain.  Refills sent in.  Return in 2 months.      Cough - Primary    Acute and improving at this time.  Overall recent labs WNL with exception of LFTs and lung CT no acute issues noted.  Will plan to repeat lung CT in one year due to 5 mm nodule being noted and current smoker.  At this time continue Albuterol as needed, will plan on spirometry next visit to assess baseline lung function, ?underlying asthma -- has no history of childhood asthma.  Return in 2 months.      Elevated LFTs    Has cut back on alcohol intake, plan on recheck LFT beginning of December and recommend  continue cut back.      Relevant Orders   Hepatic function panel       Follow up plan: Return in about 2 months (around 07/15/2020) for Cough -- with spirometry needed .

## 2020-05-15 NOTE — Patient Instructions (Signed)

## 2020-05-15 NOTE — Assessment & Plan Note (Signed)
I have recommended complete cessation of tobacco use. I have discussed various options available for assistance with tobacco cessation including over the counter methods (Nicotine gum, patch and lozenges). We also discussed prescription options (Chantix, Nicotine Inhaler / Nasal Spray). The patient is not interested in pursuing any prescription tobacco cessation options at this time.  

## 2020-05-15 NOTE — Assessment & Plan Note (Signed)
Acute and improving at this time.  Overall recent labs WNL with exception of LFTs and lung CT no acute issues noted.  Will plan to repeat lung CT in one year due to 5 mm nodule being noted and current smoker.  At this time continue Albuterol as needed, will plan on spirometry next visit to assess baseline lung function, ?underlying asthma -- has no history of childhood asthma.  Return in 2 months.

## 2020-05-15 NOTE — Assessment & Plan Note (Signed)
Has cut back on alcohol intake, plan on recheck LFT beginning of December and recommend continue cut back.

## 2020-05-16 ENCOUNTER — Encounter: Payer: Self-pay | Admitting: Nurse Practitioner

## 2020-05-31 ENCOUNTER — Other Ambulatory Visit: Payer: Commercial Managed Care - PPO

## 2020-05-31 ENCOUNTER — Other Ambulatory Visit: Payer: Self-pay

## 2020-05-31 DIAGNOSIS — R7989 Other specified abnormal findings of blood chemistry: Secondary | ICD-10-CM

## 2020-06-01 LAB — HEPATIC FUNCTION PANEL
ALT: 33 IU/L (ref 0–44)
AST: 26 IU/L (ref 0–40)
Albumin: 4.7 g/dL (ref 4.0–5.0)
Alkaline Phosphatase: 92 IU/L (ref 44–121)
Bilirubin Total: 0.3 mg/dL (ref 0.0–1.2)
Bilirubin, Direct: 0.13 mg/dL (ref 0.00–0.40)
Total Protein: 7.2 g/dL (ref 6.0–8.5)

## 2020-06-01 NOTE — Progress Notes (Signed)
Contacted via MyChart  Good afternoon Rodney Higgins, your labs have returned and liver function tests MUCH improved!!  Continue to cut back alcohol use and avoid Tylenol use daily.  Any questions? Keep being awesome!!  Thank you for allowing me to participate in your care. Kindest regards, Lachlyn Vanderstelt

## 2020-07-18 ENCOUNTER — Ambulatory Visit: Payer: Commercial Managed Care - PPO | Admitting: Nurse Practitioner

## 2020-07-26 ENCOUNTER — Ambulatory Visit: Payer: Commercial Managed Care - PPO | Admitting: Nurse Practitioner

## 2020-10-03 ENCOUNTER — Encounter: Payer: Self-pay | Admitting: Nurse Practitioner

## 2020-10-03 ENCOUNTER — Ambulatory Visit: Payer: Commercial Managed Care - PPO | Admitting: Nurse Practitioner

## 2020-10-03 ENCOUNTER — Other Ambulatory Visit: Payer: Self-pay

## 2020-10-03 VITALS — BP 118/83 | HR 83 | Temp 97.9°F | Wt 170.6 lb

## 2020-10-03 DIAGNOSIS — G2581 Restless legs syndrome: Secondary | ICD-10-CM

## 2020-10-03 DIAGNOSIS — M5441 Lumbago with sciatica, right side: Secondary | ICD-10-CM

## 2020-10-03 DIAGNOSIS — N5312 Painful ejaculation: Secondary | ICD-10-CM

## 2020-10-03 DIAGNOSIS — G8929 Other chronic pain: Secondary | ICD-10-CM

## 2020-10-03 MED ORDER — IBUPROFEN 800 MG PO TABS: 800.0000 mg | ORAL_TABLET | Freq: Three times a day (TID) | ORAL | 0 refills | Status: AC | PRN

## 2020-10-03 MED ORDER — ROPINIROLE HCL 0.25 MG PO TABS: 0.2500 mg | ORAL_TABLET | Freq: Every day | ORAL | 0 refills | Status: DC

## 2020-10-03 NOTE — Progress Notes (Signed)
Acute Office Visit  Subjective:    Patient ID: Rodney Higgins, male    DOB: 03-23-1976, 45 y.o.   MRN: [id number]  Chief Complaint  Patient presents with  . Pain    Pt states that ever now and then he gets a pain in his lower back that runs down into his leg and make his leg and foot go numb    HPI Patient is in today for back and leg pain, worsening restless leg, and abdominal pain with ejaculation. He has been having pain in his testicles and pelvis when he ejaculates since his vasectomy 2 years ago. It has gotten worse over the past few months.   BACK PAIN   Starting around Christmas time, he has had 3 episodes of lower back pain that shoots down his right leg. The last episode happened 2 weeks after taking a step at work. His right leg was tingling for about a day. Every episode has been with the right leg.  Duration: months Mechanism of injury: unknown Location: Right and low back Onset: sudden Severity: severe Quality: sharp Frequency: intermittent Radiation: buttocks and R leg below the knee Aggravating factors: movement, walking and prolonged sitting Alleviating factors: NSAIDs Status: fluctuating Treatments attempted: ibuprofen  Relief with NSAIDs?: significant Nighttime pain:  yes Paresthesias / decreased sensation:  yes Bowel / bladder incontinence:  no Fevers:  no Dysuria / urinary frequency:  no   RESTLESS LEGS  He has noticed that his restless legs are getting worse at night. He recently increased to 600mg  gabapentin at night which helped for a while, but feels like the medication is not working as well. It is interfering with how long it takes him to fall asleep. He states his legs feel like a "neon light blinking" with pain.   Past Medical History:  Diagnosis Date  . Allergy   . Arthritis   . Heart murmur    diagnosed in 5th to 6th grade -- no echo -- has had EKG  . Rash   . Raynaud's syndrome     Past Surgical History:  Procedure Laterality  Date  . COLONOSCOPY WITH PROPOFOL N/A 01/07/2020   Procedure: COLONOSCOPY WITH PROPOFOL;  Surgeon: 03/09/2020, MD;  Location: [facility];  Service: Endoscopy;  Laterality: N/A;  . POLYPECTOMY  01/07/2020   Procedure: POLYPECTOMY;  Surgeon: 03/09/2020, MD;  Location: [facility];  Service: Endoscopy;;  . TONSILECTOMY, ADENOIDECTOMY, BILATERAL MYRINGOTOMY AND TUBES     age 47    Family History  Problem Relation Age of Onset  . Hypertension Mother   . Lung cancer Father 71  . Prostate cancer Paternal Grandfather   . Heart attack Paternal Grandfather   . Diabetes Maternal Uncle   . Cancer Paternal Uncle   . Stroke Maternal Grandmother   . Heart attack Maternal Grandmother   . Cancer Maternal Grandfather     Social History   Socioeconomic History  . Marital status: Single    Spouse name: Not on file  . Number of children: Not on file  . Years of education: Not on file  . Highest education level: Not on file  Occupational History  . Not on file  Tobacco Use  . Smoking status: Current Every Day Smoker    Packs/day: 0.25    Years: 29.00    Pack years: 7.25    Types: Cigarettes  . Smokeless tobacco: Never Used  . Tobacco comment: 1 or 2 a day   Vaping  Use  . Vaping Use: Never used  Substance and Sexual Activity  . Alcohol use: Yes    Alcohol/week: 11.0 standard drinks    Types: 2 Glasses of wine, 6 Cans of beer, 3 Shots of liquor per week    Comment: does not drink Mon through Friday -- only weekends  . Drug use: Not Currently    Types: Marijuana  . Sexual activity: Yes  Other Topics Concern  . Not on file  Social History Narrative  . Not on file   Social Determinants of Health   Financial Resource Strain: Low Risk   . Difficulty of Paying Living Expenses: Not hard at all  Food Insecurity: No Food Insecurity  . Worried About Programme researcher, broadcasting/film/video in the Last Year: Never true  . Ran Out of Food in the Last Year: Never true  Transportation Needs: No  Transportation Needs  . Lack of Transportation (Medical): No  . Lack of Transportation (Non-Medical): No  Physical Activity: Insufficiently Active  . Days of Exercise per Week: 3 days  . Minutes of Exercise per Session: 30 min  Stress: No Stress Concern Present  . Feeling of Stress : Only a little  Social Connections: Unknown  . Frequency of Communication with Friends and Family: Three times a week  . Frequency of Social Gatherings with Friends and Family: Three times a week  . Attends Religious Services: Never  . Active Member of Clubs or Organizations: No  . Attends Banker Meetings: Never  . Marital Status: Not on file  Intimate Partner Violence: Not on file    Outpatient Medications Prior to Visit  Medication Sig Dispense Refill  . albuterol (VENTOLIN HFA) 108 (90 Base) MCG/ACT inhaler Inhale 2 puffs into the lungs every 6 (six) hours as needed for wheezing or shortness of breath. 8 g 2  . amLODipine (NORVASC) 5 MG tablet Take 1 tablet (5 mg total) by mouth daily. 90 tablet 3  . cetirizine (ZYRTEC) 10 MG tablet Take 10 mg by mouth daily.    Marland Kitchen gabapentin (NEURONTIN) 300 MG capsule Take 2 capsules (600 mg total) by mouth at bedtime. 180 capsule 4  . triamcinolone cream (KENALOG) 0.1 % APPLY A THIN LAYER TO AFFECTED AREAS TWICE DAILY AS NEEDED    . valACYclovir (VALTREX) 1000 MG tablet Take 1 tablet (1,000 mg total) by mouth daily. 90 tablet 4  . Sod Picosulfate-Mag Ox-Cit Acd (CLENPIQ) 10-3.5-12 MG-GM -GM/160ML SOLN Take 320 mLs by mouth daily. (Patient not taking: Reported on 05/15/2020) 320 mL 0   No facility-administered medications prior to visit.    Allergies  Allergen Reactions  . Penicillins Hives    Review of Systems  Constitutional: Positive for fatigue.  Respiratory: Negative.   Cardiovascular: Negative.   Gastrointestinal: Negative.        Objective:    Physical Exam Vitals and nursing note reviewed.  Constitutional:      Appearance: Normal  appearance.  HENT:     Head: Normocephalic.  Eyes:     Conjunctiva/sclera: Conjunctivae normal.  Cardiovascular:     Rate and Rhythm: Normal rate and regular rhythm.     Pulses: Normal pulses.     Heart sounds: Murmur heard.    Pulmonary:     Effort: Pulmonary effort is normal.     Breath sounds: Normal breath sounds.  Musculoskeletal:        General: No tenderness.     Cervical back: Normal range of motion.  Comments: Pain with ROM however is full. Negative straight leg raise.   Skin:    General: Skin is warm.  Neurological:     General: No focal deficit present.     Mental Status: He is alert and oriented to person, place, and time.  Psychiatric:        Mood and Affect: Mood normal.        Behavior: Behavior normal.        Thought Content: Thought content normal.        Judgment: Judgment normal.     BP 118/83   Pulse 83   Temp 97.9 F (36.6 C) (Oral)   Wt 170 lb 9.6 oz (77.4 kg)   SpO2 98%   BMI 25.94 kg/m  Wt Readings from Last 3 Encounters:  10/03/20 170 lb 9.6 oz (77.4 kg)  05/15/20 170 lb 9.6 oz (77.4 kg)  05/01/20 163 lb 12.8 oz (74.3 kg)    Health Maintenance Due  Topic Date Due  . PNEUMOCOCCAL POLYSACCHARIDE VACCINE AGE 35-64 HIGH RISK  Never done  . COVID-19 Vaccine (1) Never done    There are no preventive care reminders to display for this patient.   Lab Results  Component Value Date   TSH 1.140 05/01/2020   Lab Results  Component Value Date   WBC 4.5 05/01/2020   HGB 15.6 05/01/2020   HCT 46.5 05/01/2020   MCV 94 05/01/2020   PLT 262 05/01/2020   Lab Results  Component Value Date   NA 135 05/01/2020   K 4.5 05/01/2020   CO2 24 05/01/2020   GLUCOSE 87 05/01/2020   BUN 10 05/01/2020   CREATININE 1.12 05/01/2020   BILITOT 0.3 05/31/2020   ALKPHOS 92 05/31/2020   AST 26 05/31/2020   ALT 33 05/31/2020   PROT 7.2 05/31/2020   ALBUMIN 4.7 05/31/2020   CALCIUM 9.6 05/01/2020   ANIONGAP 6 03/28/2016   Lab Results  Component  Value Date   CHOL 160 11/16/2019   Lab Results  Component Value Date   HDL 55 11/16/2019   Lab Results  Component Value Date   LDLCALC 77 11/16/2019   Lab Results  Component Value Date   TRIG 163 (H) 11/16/2019       Assessment & Plan:   Problem List Items Addressed This Visit      Nervous and Auditory   Chronic right-sided low back pain with right-sided sciatica - Primary    Has had 3 exacerbations of sciatica pain since December. Currently he is pain free. No red flags on exam. Will give him stretches to do at home. If he has pain, he can take ibuprofen and use ice or heat. If pain occurs more frequently or gets worse, can refer to PT and consider imaging. Has an appointment scheduled for next month.       Relevant Medications   ibuprofen (ADVIL) 800 MG tablet   rOPINIRole (REQUIP) 0.25 MG tablet     Other   Restless leg syndrome    Chronic, worsening. Has increased his gabapentin to 600mg  at bedtime which helped for a while, but now it is worsening again. Will send in prescription for ropinirole to pharmacy. Check vitamin B12 at next visit. Follow-up in 4 weeks.        Other Visit Diagnoses    Pain with ejaculation       Encouraged him to follow-up with his urologist. If he needs a new referral, call the office.  Meds ordered this encounter  Medications  . ibuprofen (ADVIL) 800 MG tablet    Sig: Take 1 tablet (800 mg total) by mouth every 8 (eight) hours as needed.    Dispense:  30 tablet    Refill:  0  . rOPINIRole (REQUIP) 0.25 MG tablet    Sig: Take 1 tablet (0.25 mg total) by mouth at bedtime. Take 1 tablet every night for 2 days, then take 2 tablets every night    Dispense:  60 tablet    Refill:  0     Gerre Scull, NP

## 2020-10-03 NOTE — Patient Instructions (Addendum)
Sciatica  Sciatica is pain, numbness, weakness, or tingling along the path of the sciatic nerve. The sciatic nerve starts in the lower back and runs down the back of each leg. The nerve controls the muscles in the lower leg and in the back of the knee. It also provides feeling (sensation) to the back of the thigh, the lower leg, and the sole of the foot. Sciatica is a symptom of another medical condition that pinches or puts pressure on the sciatic nerve. Sciatica most often only affects one side of the body. Sciatica usually goes away on its own or with treatment. In some cases, sciatica may come back (recur). What are the causes? This condition is caused by pressure on the sciatic nerve or pinching of the nerve. This may be the result of:  A disk in between the bones of the spine bulging out too far (herniated disk).  Age-related changes in the spinal disks.  A pain disorder that affects a muscle in the buttock.  Extra bone growth near the sciatic nerve.  A break (fracture) of the pelvis.  Pregnancy.  Tumor. This is rare. What increases the risk? The following factors may make you more likely to develop this condition:  Playing sports that place pressure or stress on the spine.  Having poor strength and flexibility.  A history of back injury or surgery.  Sitting for long periods of time.  Doing activities that involve repetitive bending or lifting.  Obesity. What are the signs or symptoms? Symptoms can vary from mild to very severe, and they may include:  Any of these problems in the lower back, leg, hip, or buttock: ? Mild tingling, numbness, or dull aches. ? Burning sensations. ? Sharp pains.  Numbness in the back of the calf or the sole of the foot.  Leg weakness.  Severe back pain that makes movement difficult. Symptoms may get worse when you cough, sneeze, or laugh, or when you sit or stand for long periods of time. How is this diagnosed? This condition may be  diagnosed based on:  Your symptoms and medical history.  A physical exam.  Blood tests.  Imaging tests, such as: ? X-rays. ? MRI. ? CT scan. How is this treated? In many cases, this condition improves on its own without treatment. However, treatment may include:  Reducing or modifying physical activity.  Exercising and stretching.  Icing and applying heat to the affected area.  Medicines that help to: ? Relieve pain and swelling. ? Relax your muscles.  Injections of medicines that help to relieve pain, irritation, and inflammation around the sciatic nerve (steroids).  Surgery. Follow these instructions at home: Medicines  Take over-the-counter and prescription medicines only as told by your health care provider.  Ask your health care provider if the medicine prescribed to you: ? Requires you to avoid driving or using heavy machinery. ? Can cause constipation. You may need to take these actions to prevent or treat constipation:  Drink enough fluid to keep your urine pale yellow.  Take over-the-counter or prescription medicines.  Eat foods that are high in fiber, such as beans, whole grains, and fresh fruits and vegetables.  Limit foods that are high in fat and processed sugars, such as fried or sweet foods. Managing pain  If directed, put ice on the affected area. ? Put ice in a plastic bag. ? Place a towel between your skin and the bag. ? Leave the ice on for 20 minutes, 2-3 times a day.  If directed, apply heat to the affected area. Use the heat source that your health care provider recommends, such as a moist heat pack or a heating pad. ? Place a towel between your skin and the heat source. ? Leave the heat on for 20-30 minutes. ? Remove the heat if your skin turns bright red. This is especially important if you are unable to feel pain, heat, or cold. You may have a greater risk of getting burned.      Activity  Return to your normal activities as told by  your health care provider. Ask your health care provider what activities are safe for you.  Avoid activities that make your symptoms worse.  Take brief periods of rest throughout the day. ? When you rest for longer periods, mix in some mild activity or stretching between periods of rest. This will help to prevent stiffness and pain. ? Avoid sitting for long periods of time without moving. Get up and move around at least one time each hour.  Exercise and stretch regularly, as told by your health care provider.  Do not lift anything that is heavier than 10 lb (4.5 kg) while you have symptoms of sciatica. When you do not have symptoms, you should still avoid heavy lifting, especially repetitive heavy lifting.  When you lift objects, always use proper lifting technique, which includes: ? Bending your knees. ? Keeping the load close to your body. ? Avoiding twisting.   General instructions  Maintain a healthy weight. Excess weight puts extra stress on your back.  Wear supportive, comfortable shoes. Avoid wearing high heels.  Avoid sleeping on a mattress that is too soft or too hard. A mattress that is firm enough to support your back when you sleep may help to reduce your pain.  Keep all follow-up visits as told by your health care provider. This is important. Contact a health care provider if:  You have pain that: ? Wakes you up when you are sleeping. ? Gets worse when you lie down. ? Is worse than you have experienced in the past. ? Lasts longer than 4 weeks.  You have an unexplained weight loss. Get help right away if:  You are not able to control when you urinate or have bowel movements (incontinence).  You have: ? Weakness in your lower back, pelvis, buttocks, or legs that gets worse. ? Redness or swelling of your back. ? A burning sensation when you urinate. Summary  Sciatica is pain, numbness, weakness, or tingling along the path of the sciatic nerve.  This condition  is caused by pressure on the sciatic nerve or pinching of the nerve.  Sciatica can cause pain, numbness, or tingling in the lower back, legs, hips, and buttocks.  Treatment often includes rest, exercise, medicines, and applying ice or heat. This information is not intended to replace advice given to you by your health care provider. Make sure you discuss any questions you have with your health care provider. Document Revised: 07/06/2018 Document Reviewed: 07/06/2018 Elsevier Patient Education  2021 [employer]   Sciatica Rehab Ask your health care provider which exercises are safe for you. Do exercises exactly as told by your health care provider and adjust them as directed. It is normal to feel mild stretching, pulling, tightness, or discomfort as you do these exercises. Stop right away if you feel sudden pain or your pain gets worse. Do not begin these exercises until told by your health care provider. Stretching and range-of-motion exercises These   exercises warm up your muscles and joints and improve the movement and flexibility of your hips and back. These exercises also help to relieve pain, numbness, and tingling. Sciatic nerve glide 1. Sit in a chair with your head facing down toward your chest. Place your hands behind your back. Let your shoulders slump forward. 2. Slowly straighten one of your legs while you tilt your head back as if you are looking toward the ceiling. Only straighten your leg as far as you can without making your symptoms worse. 3. Hold this position for __________ seconds. 4. Slowly return to the starting position. 5. Repeat with your other leg. Repeat __________ times. Complete this exercise __________ times a day. Knee to chest with hip adduction and internal rotation 1. Lie on your back on a firm surface with both legs straight. 2. Bend one of your knees and move it up toward your chest until you feel a gentle stretch in your lower back and buttock. Then, move  your knee toward the shoulder that is on the opposite side from your leg. This is hip adduction and internal rotation. ? Hold your leg in this position by holding on to the front of your knee. 3. Hold this position for __________ seconds. 4. Slowly return to the starting position. 5. Repeat with your other leg. Repeat __________ times. Complete this exercise __________ times a day.   Prone extension on elbows 1. Lie on your abdomen on a firm surface. A bed may be too soft for this exercise. 2. Prop yourself up on your elbows. 3. Use your arms to help lift your chest up until you feel a gentle stretch in your abdomen and your lower back. ? This will place some of your body weight on your elbows. If this is uncomfortable, try stacking pillows under your chest. ? Your hips should stay down, against the surface that you are lying on. Keep your hip and back muscles relaxed. 4. Hold this position for __________ seconds. 5. Slowly relax your upper body and return to the starting position. Repeat __________ times. Complete this exercise __________ times a day.   Strengthening exercises These exercises build strength and endurance in your back. Endurance is the ability to use your muscles for a long time, even after they get tired. Pelvic tilt This exercise strengthens the muscles that lie deep in the abdomen. 1. Lie on your back on a firm surface. Bend your knees and keep your feet flat on the floor. 2. Tense your abdominal muscles. Tip your pelvis up toward the ceiling and flatten your lower back into the floor. ? To help with this exercise, you may place a small towel under your lower back and try to push your back into the towel. 3. Hold this position for __________ seconds. 4. Let your muscles relax completely before you repeat this exercise. Repeat __________ times. Complete this exercise __________ times a day. Alternating arm and leg raises 1. Get on your hands and knees on a firm surface.  If you are on a hard floor, you may want to use padding, such as an exercise mat, to cushion your knees. 2. Line up your arms and legs. Your hands should be directly below your shoulders, and your knees should be directly below your hips. 3. Lift your left leg behind you. At the same time, raise your right arm and straighten it in front of you. ? Do not lift your leg higher than your hip. ? Do not lift your arm   higher than your shoulder. ? Keep your abdominal and back muscles tight. ? Keep your hips facing the ground. ? Do not arch your back. ? Keep your balance carefully, and do not hold your breath. 4. Hold this position for __________ seconds. 5. Slowly return to the starting position. 6. Repeat with your right leg and your left arm. Repeat __________ times. Complete this exercise __________ times a day.   Posture and body mechanics Good posture and healthy body mechanics can help to relieve stress in your body's tissues and joints. Body mechanics refers to the movements and positions of your body while you do your daily activities. Posture is part of body mechanics. Good posture means:  Your spine is in its natural S-curve position (neutral).  Your shoulders are pulled back slightly.  Your head is not tipped forward. Follow these guidelines to improve your posture and body mechanics in your everyday activities. Standing  When standing, keep your spine neutral and your feet about hip width apart. Keep a slight bend in your knees. Your ears, shoulders, and hips should line up.  When you do a task in which you stand in one place for a long time, place one foot up on a stable object that is 2-4 inches (5-10 cm) high, such as a footstool. This helps keep your spine neutral.   Sitting  When sitting, keep your spine neutral and keep your feet flat on the floor. Use a footrest, if necessary, and keep your thighs parallel to the floor. Avoid rounding your shoulders, and avoid tilting your  head forward.  When working at a desk or a computer, keep your desk at a height where your hands are slightly lower than your elbows. Slide your chair under your desk so you are close enough to maintain good posture.  When working at a computer, place your monitor at a height where you are looking straight ahead and you do not have to tilt your head forward or downward to look at the screen.   Resting  When lying down and resting, avoid positions that are most painful for you.  If you have pain with activities such as sitting, bending, stooping, or squatting, lie in a position in which your body does not bend very much. For example, avoid curling up on your side with your arms and knees near your chest (fetal position).  If you have pain with activities such as standing for a long time or reaching with your arms, lie with your spine in a neutral position and bend your knees slightly. Try the following positions: ? Lying on your side with a pillow between your knees. ? Lying on your back with a pillow under your knees. Lifting  When lifting objects, keep your feet at least shoulder width apart and tighten your abdominal muscles.  Bend your knees and hips and keep your spine neutral. It is important to lift using the strength of your legs, not your back. Do not lock your knees straight out.  Always ask for help to lift heavy or awkward objects.   This information is not intended to replace advice given to you by your health care provider. Make sure you discuss any questions you have with your health care provider. Document Revised: 10/09/2018 Document Reviewed: 07/09/2018 Elsevier Patient Education  2021 [employer].    Neck Exercises Ask your health care provider which exercises are safe for you. Do exercises exactly as told by your health care provider and adjust them as  directed. It is normal to feel mild stretching, pulling, tightness, or discomfort as you do these exercises. Stop  right away if you feel sudden pain or your pain gets worse. Do not begin these exercises until told by your health care provider. Neck exercises can be important for many reasons. They can improve strength and maintain flexibility in your neck, which will help your upper back and prevent neck pain. Stretching exercises Rotation neck stretching 1. Sit in a chair or stand up. 2. Place your feet flat on the floor, shoulder width apart. 3. Slowly turn your head (rotate) to the right until a slight stretch is felt. Turn it all the way to the right so you can look over your right shoulder. Do not tilt or tip your head. 4. Hold this position for 10-30 seconds. 5. Slowly turn your head (rotate) to the left until a slight stretch is felt. Turn it all the way to the left so you can look over your left shoulder. Do not tilt or tip your head. 6. Hold this position for 10-30 seconds. Repeat __________ times. Complete this exercise __________ times a day.   Neck retraction 1. Sit in a sturdy chair or stand up. 2. Look straight ahead. Do not bend your neck. 3. Use your fingers to push your chin backward (retraction). Do not bend your neck for this movement. Continue to face straight ahead. If you are doing the exercise properly, you will feel a slight sensation in your throat and a stretch at the back of your neck. 4. Hold the stretch for 1-2 seconds. Repeat __________ times. Complete this exercise __________ times a day. Strengthening exercises Neck press 1. Lie on your back on a firm bed or on the floor with a pillow under your head. 2. Use your neck muscles to push your head down on the pillow and straighten your spine. 3. Hold the position as well as you can. Keep your head facing up (in a neutral position) and your chin tucked. 4. Slowly count to 5 while holding this position. Repeat __________ times. Complete this exercise __________ times a day. Isometrics These are exercises in which you strengthen  the muscles in your neck while keeping your neck still (isometrics). 1. Sit in a supportive chair and place your hand on your forehead. 2. Keep your head and face facing straight ahead. Do not flex or extend your neck while doing isometrics. 3. Push forward with your head and neck while pushing back with your hand. Hold for 10 seconds. 4. Do the sequence again, this time putting your hand against the back of your head. Use your head and neck to push backward against the hand pressure. 5. Finally, do the same exercise on either side of your head, pushing sideways against the pressure of your hand. Repeat __________ times. Complete this exercise __________ times a day. Prone head lifts 1. Lie face-down (prone position), resting on your elbows so that your chest and upper back are raised. 2. Start with your head facing downward, near your chest. Position your chin either on or near your chest. 3. Slowly lift your head upward. Lift until you are looking straight ahead. Then continue lifting your head as far back as you can comfortably stretch. 4. Hold your head up for 5 seconds. Then slowly lower it to your starting position. Repeat __________ times. Complete this exercise __________ times a day. Supine head lifts 1. Lie on your back (supine position), bending your knees to point to the  ceiling and keeping your feet flat on the floor. 2. Lift your head slowly off the floor, raising your chin toward your chest. 3. Hold for 5 seconds. Repeat __________ times. Complete this exercise __________ times a day. Scapular retraction 1. Stand with your arms at your sides. Look straight ahead. 2. Slowly pull both shoulders (scapulae) backward and downward (retraction) until you feel a stretch between your shoulder blades in your upper back. 3. Hold for 10-30 seconds. 4. Relax and repeat. Repeat __________ times. Complete this exercise __________ times a day. Contact a health care provider if:  Your neck  pain or discomfort gets much worse when you do an exercise.  Your neck pain or discomfort does not improve within 2 hours after you exercise. If you have any of these problems, stop exercising right away. Do not do the exercises again unless your health care provider says that you can. Get help right away if:  You develop sudden, severe neck pain. If this happens, stop exercising right away. Do not do the exercises again unless your health care provider says that you can. This information is not intended to replace advice given to you by your health care provider. Make sure you discuss any questions you have with your health care provider. Document Revised: 04/15/2018 Document Reviewed: 04/15/2018 Elsevier Patient Education  2021 [employer].

## 2020-10-03 NOTE — Assessment & Plan Note (Signed)
Chronic, worsening. Has increased his gabapentin to 600mg  at bedtime which helped for a while, but now it is worsening again. Will send in prescription for ropinirole to pharmacy. Check vitamin B12 at next visit. Follow-up in 4 weeks.

## 2020-10-03 NOTE — Assessment & Plan Note (Signed)
Has had 3 exacerbations of sciatica pain since December. Currently he is pain free. No red flags on exam. Will give him stretches to do at home. If he has pain, he can take ibuprofen and use ice or heat. If pain occurs more frequently or gets worse, can refer to PT and consider imaging. Has an appointment scheduled for next month.

## 2020-10-14 ENCOUNTER — Other Ambulatory Visit: Payer: Self-pay | Admitting: Nurse Practitioner

## 2020-10-14 NOTE — Telephone Encounter (Signed)
Requested Prescriptions  Pending Prescriptions Disp Refills  . amLODipine (NORVASC) 5 MG tablet [Pharmacy Med Name: AMLODIPINE BESYLATE 5 MG TAB] 90 tablet 0    Sig: TAKE 1 TABLET BY MOUTH EVERY DAY     Cardiovascular:  Calcium Channel Blockers Passed - 10/14/2020  8:39 AM      Passed - Last BP in normal range    BP Readings from Last 1 Encounters:  10/03/20 118/83         Passed - Valid encounter within last 6 months    Recent Outpatient Visits          1 week ago Chronic right-sided low back pain with right-sided sciatica   [facility] Mc[place]e, Lauren A, NP   5 months ago Cough   [facility] Holcomb, Canones T, NP   5 months ago Cough   [facility] [place], Sayner T, NP   9 months ago Herpes zoster without complication   [facility] Cannady, Dorie Rank, NP   11 months ago Audiological scientist for annual health examination   [facility] [place], Dorie Rank, NP      Future Appointments            In 1 month Cannady, Dorie Rank, NP [facility] [facility]

## 2020-10-20 ENCOUNTER — Telehealth (INDEPENDENT_AMBULATORY_CARE_PROVIDER_SITE_OTHER): Payer: Commercial Managed Care - PPO | Admitting: Nurse Practitioner

## 2020-10-20 ENCOUNTER — Encounter: Payer: Self-pay | Admitting: Nurse Practitioner

## 2020-10-20 ENCOUNTER — Telehealth: Payer: Self-pay

## 2020-10-20 ENCOUNTER — Other Ambulatory Visit: Payer: Self-pay

## 2020-10-20 DIAGNOSIS — M5441 Lumbago with sciatica, right side: Secondary | ICD-10-CM | POA: Diagnosis not present

## 2020-10-20 DIAGNOSIS — I7300 Raynaud's syndrome without gangrene: Secondary | ICD-10-CM | POA: Diagnosis not present

## 2020-10-20 DIAGNOSIS — F17210 Nicotine dependence, cigarettes, uncomplicated: Secondary | ICD-10-CM | POA: Diagnosis not present

## 2020-10-20 DIAGNOSIS — G8929 Other chronic pain: Secondary | ICD-10-CM

## 2020-10-20 DIAGNOSIS — G2581 Restless legs syndrome: Secondary | ICD-10-CM | POA: Diagnosis not present

## 2020-10-20 MED ORDER — PREDNISONE 10 MG PO TABS: ORAL_TABLET | ORAL | 0 refills | Status: DC

## 2020-10-20 NOTE — Telephone Encounter (Signed)
lvm to see about getting scheduled for lab visit per PCP and also do copay for today's visit.

## 2020-10-20 NOTE — Assessment & Plan Note (Signed)
Ongoing and improved with Gabapentin & Requip.  Will continue Gabapentin 600 MG QHS + Requip and adjust as needed.  Due to underlying RLS and Raynaud, further assess labs for underlying inflammatory conditions -- will check ANA, CRP, ESR, and B12 level.  Obtain imaging of lumbar spine.  Return as scheduled in upcoming weeks for follow-up.

## 2020-10-20 NOTE — Progress Notes (Signed)
There were no vitals taken for this visit.   Subjective:    Patient ID: Rodney Higgins, male    DOB: 1976-06-20, 45 y.o.   MRN: [id number]  HPI: Rodney Higgins is a 45 y.o. male  Chief Complaint  Patient presents with  . Leg Pain    Pt states he has been having R sided leg pain that has been bothering him for the last few days. States it starts in his R lower back and down into his leg. States he stepped wrong a few days ago and felt it.     . This visit was completed via video visit through MyChart due to the restrictions of the COVID-19 pandemic. All issues as above were discussed and addressed. Physical exam was done as above through visual confirmation on video through MyChart. If it was felt that the patient should be evaluated in the office, they were directed there. The patient verbally consented to this visit. . Location of the patient: home . Location of the provider: home . Those involved with this call:  . Provider: Marnee Guarneri, DNP . CMA: Yvonna Alanis, CMA . Front Desk/Registration: Jill Side  . Time spent on call: 21 minutes with patient face to face via video conference. More than 50% of this time was spent in counseling and coordination of care. 15 minutes total spent in review of patient's record and preparation of their chart.  . I verified patient identity using two factors (patient name and date of birth). Patient consents verbally to being seen via telemedicine visit today.    RIGHT SIDE LEG AND LOWER BACK PAIN Has been present for last few days to right lower back and radiating into right leg.  Reports stepping wrong and then feeling it after this.  Has underlying chronic back pain with acute flares.  No recent imaging in chart noted.  Takes Gabapentin 600 MG daily and Requip for restless leg syndrome.  Was seen on 10/03/20 for similar, reports ongoing sciatic pain issues.  He is also treated for Raynaud's, currently with Amlodipine. Duration:  days Mechanism of injury: stepped the wrong way Location: Right and low back Onset: gradual Severity: 2/10 at present, at worst is an 8/10 Quality: sharp and radiating -- lasts for 1-2 minute and then dulls Frequency: intermittent Radiation: R leg below the knee Aggravating factors: unknown -- intermittent Alleviating factors: Requip at night and Gabapentin Status: fluctuating Treatments attempted: Requip and Gabapentin Relief with NSAIDs?: No NSAIDs Taken Nighttime pain:  no Paresthesias / decreased sensation:  no Bowel / bladder incontinence:  no Fevers:  no Dysuria / urinary frequency:  no  Relevant past medical, surgical, family and social history reviewed and updated as indicated. Interim medical history since our last visit reviewed. Allergies and medications reviewed and updated.  Review of Systems  Constitutional: Negative for activity change, diaphoresis, fatigue and fever.  Respiratory: Negative for cough, chest tightness, shortness of breath and wheezing.   Cardiovascular: Negative for chest pain, palpitations and leg swelling.  Gastrointestinal: Negative.   Musculoskeletal: Positive for back pain.  Skin: Negative for rash.  Neurological: Negative.   Psychiatric/Behavioral: Negative.     Per HPI unless specifically indicated above     Objective:    There were no vitals taken for this visit.  Wt Readings from Last 3 Encounters:  10/03/20 170 lb 9.6 oz (77.4 kg)  05/15/20 170 lb 9.6 oz (77.4 kg)  05/01/20 163 lb 12.8 oz (74.3 kg)    Physical  Exam Vitals and nursing note reviewed.  Constitutional:      General: He is awake. He is not in acute distress.    Appearance: He is well-developed. He is not ill-appearing.  HENT:     Head: Normocephalic.     Right Ear: Hearing normal. No drainage.     Left Ear: Hearing normal. No drainage.  Eyes:     General: Lids are normal.        Right eye: No discharge.        Left eye: No discharge.     Conjunctiva/sclera:  Conjunctivae normal.  Pulmonary:     Effort: Pulmonary effort is normal. No accessory muscle usage or respiratory distress.  Musculoskeletal:     Cervical back: Normal range of motion.  Neurological:     Mental Status: He is alert and oriented to person, place, and time.  Psychiatric:        Mood and Affect: Mood normal.        Behavior: Behavior normal. Behavior is cooperative.        Thought Content: Thought content normal.        Judgment: Judgment normal.    Results for orders placed or performed in visit on 05/31/20  Hepatic function panel  Result Value Ref Range   Total Protein 7.2 6.0 - 8.5 g/dL   Albumin 4.7 4.0 - 5.0 g/dL   Bilirubin Total 0.3 0.0 - 1.2 mg/dL   Bilirubin, Direct 0.13 0.00 - 0.40 mg/dL   Alkaline Phosphatase 92 44 - 121 IU/L   AST 26 0 - 40 IU/L   ALT 33 0 - 44 IU/L      Assessment & Plan:   Problem List Items Addressed This Visit      Nervous and Auditory   Chronic right-sided low back pain with right-sided sciatica - Primary    Ongoing issue with acute flares, will obtain imaging for baseline.  Lumbar spine x-ray ordered and instructed patient on where to obtain.  At this time will treat with Prednisone taper, he has tolerated this before.  Continue Gabapentin and Requip at current doses, this is helping with RLS symptoms at night.  Due to underlying RLS and Raynaud, further assess labs for underlying inflammatory conditions -- will check ANA, CRP, ESR, and B12 level.  Continue at home treatments and rest, work note provided.  Return as schedule in upcoming weeks for follow-up.  If ongoing symptoms will consider MRI in future.      Relevant Medications   predniSONE (DELTASONE) 10 MG tablet   Other Relevant Orders   DG Lumbar Spine Complete     Other   Raynaud phenomenon    Chronic, well-controlled with Amlodipine.  Continue current medication regimen and adjust as needed.  Recommend complete cessation of smoking.  Due to underlying RLS and Raynaud,  further assess labs for underlying inflammatory conditions -- will check ANA, CRP, ESR, and B12 level.  Return as scheduled for physical in upcoming weeks.      Relevant Orders   Sed Rate (ESR)   C-reactive protein   ANA w/Reflex if Positive   B12   Nicotine dependence, cigarettes, uncomplicated    I have recommended complete cessation of tobacco use. I have discussed various options available for assistance with tobacco cessation including over the counter methods (Nicotine gum, patch and lozenges). We also discussed prescription options (Chantix, Nicotine Inhaler / Nasal Spray). The patient is not interested in pursuing any prescription tobacco cessation options at this  time.       Restless leg syndrome    Ongoing and improved with Gabapentin & Requip.  Will continue Gabapentin 600 MG QHS + Requip and adjust as needed.  Due to underlying RLS and Raynaud, further assess labs for underlying inflammatory conditions -- will check ANA, CRP, ESR, and B12 level.  Obtain imaging of lumbar spine.  Return as scheduled in upcoming weeks for follow-up.         I discussed the assessment and treatment plan with the patient. The patient was provided an opportunity to ask questions and all were answered. The patient agreed with the plan and demonstrated an understanding of the instructions.   The patient was advised to call back or seek an in-person evaluation if the symptoms worsen or if the condition fails to improve as anticipated.   I provided 21+ minutes of time during this encounter.  Follow up plan: Return for as scheduled for upcoming physical.

## 2020-10-20 NOTE — Assessment & Plan Note (Signed)
Chronic, well-controlled with Amlodipine.  Continue current medication regimen and adjust as needed.  Recommend complete cessation of smoking.  Due to underlying RLS and Raynaud, further assess labs for underlying inflammatory conditions -- will check ANA, CRP, ESR, and B12 level.  Return as scheduled for physical in upcoming weeks.

## 2020-10-20 NOTE — Assessment & Plan Note (Signed)
I have recommended complete cessation of tobacco use. I have discussed various options available for assistance with tobacco cessation including over the counter methods (Nicotine gum, patch and lozenges). We also discussed prescription options (Chantix, Nicotine Inhaler / Nasal Spray). The patient is not interested in pursuing any prescription tobacco cessation options at this time.  

## 2020-10-20 NOTE — Assessment & Plan Note (Signed)
Ongoing issue with acute flares, will obtain imaging for baseline.  Lumbar spine x-ray ordered and instructed patient on where to obtain.  At this time will treat with Prednisone taper, he has tolerated this before.  Continue Gabapentin and Requip at current doses, this is helping with RLS symptoms at night.  Due to underlying RLS and Raynaud, further assess labs for underlying inflammatory conditions -- will check ANA, CRP, ESR, and B12 level.  Continue at home treatments and rest, work note provided.  Return as schedule in upcoming weeks for follow-up.  If ongoing symptoms will consider MRI in future.

## 2020-10-20 NOTE — Patient Instructions (Signed)
Chronic Back Pain When back pain lasts longer than 3 months, it is called chronic back pain. Pain may get worse at certain times (flare-ups). There are things you can do at home to manage your pain. Follow these instructions at home: Pay attention to any changes in your symptoms. Take these actions to help with your pain: Managing pain and stiffness  If told, put ice on the painful area. Your doctor may tell you to use ice for 24-48 hours after the flare-up starts. To do this: ? Put ice in a plastic bag. ? Place a towel between your skin and the bag. ? Leave the ice on for 20 minutes, 2-3 times a day.  If told, put heat on the painful area. Do this as often as told by your doctor. Use the heat source that your doctor recommends, such as a moist heat pack or a heating pad. ? Place a towel between your skin and the heat source. ? Leave the heat on for 20-30 minutes. ? Take off the heat if your skin turns bright red. This is especially important if you are unable to feel pain, heat, or cold. You may have a greater risk of getting burned.  Soak in a warm bath. This can help relieve pain.      Activity  Avoid bending and other activities that make pain worse.  When standing: ? Keep your upper back and neck straight. ? Keep your shoulders pulled back. ? Avoid slouching.  When sitting: ? Keep your back straight. ? Relax your shoulders. Do not round your shoulders or pull them backward.  Do not sit or stand in one place for long periods of time.  Take short rest breaks during the day. Lying down or standing is usually better than sitting. Resting can help relieve pain.  When sitting or lying down for a long time, do some mild activity or stretching. This will help to prevent stiffness and pain.  Get regular exercise. Ask your doctor what activities are safe for you.  Do not lift anything that is heavier than 10 lb (4.5 kg) or the limit that you are told, until your doctor says that it  is safe.  To prevent injury when you lift things: ? Bend your knees. ? Keep the weight close to your body. ? Avoid twisting.  Sleep on a firm mattress. Try lying on your side with your knees slightly bent. If you lie on your back, put a pillow under your knees.   Medicines  Treatment may include medicines for pain and swelling taken by mouth or put on the skin, prescription pain medicine, or muscle relaxants.  Take over-the-counter and prescription medicines only as told by your doctor.  Ask your doctor if the medicine prescribed to you: ? Requires you to avoid driving or using machinery. ? Can cause trouble pooping (constipation). You may need to take these actions to prevent or treat trouble pooping:  Drink enough fluid to keep your pee (urine) pale yellow.  Take over-the-counter or prescription medicines.  Eat foods that are high in fiber. These include beans, whole grains, and fresh fruits and vegetables.  Limit foods that are high in fat and sugars. These include fried or sweet foods. General instructions  Do not use any products that contain nicotine or tobacco, such as cigarettes, e-cigarettes, and chewing tobacco. If you need help quitting, ask your doctor.  Keep all follow-up visits as told by your doctor. This is important. Contact a doctor if:    Your pain does not get better with rest or medicine.  Your pain gets worse, or you have new pain.  You have a high fever.  You lose weight very quickly.  You have trouble doing your normal activities. Get help right away if:  One or both of your legs or feet feel weak.  One or both of your legs or feet lose feeling (have numbness).  You have trouble controlling when you poop (have a bowel movement) or pee (urinate).  You have bad back pain and: ? You feel like you may vomit (nauseous), or you vomit. ? You have pain in your belly (abdomen). ? You have shortness of breath. ? You faint. Summary  When back pain  lasts longer than 3 months, it is called chronic back pain.  Pain may get worse at certain times (flare-ups).  Use ice and heat as told by your doctor. Your doctor may tell you to use ice after flare-ups. This information is not intended to replace advice given to you by your health care provider. Make sure you discuss any questions you have with your health care provider. Document Revised: 07/28/2019 Document Reviewed: 07/28/2019 Elsevier Patient Education  2021 Elsevier Inc.  

## 2020-10-27 ENCOUNTER — Telehealth: Payer: Self-pay | Admitting: *Deleted

## 2020-10-27 ENCOUNTER — Encounter: Payer: Commercial Managed Care - PPO | Admitting: Nurse Practitioner

## 2020-10-27 NOTE — Telephone Encounter (Signed)
Completed.

## 2020-10-27 NOTE — Telephone Encounter (Signed)
Pt brought in form for Rodney Higgins to complete for FMLA.Marland KitchenMarland KitchenPlaced in BIN for review

## 2020-10-27 NOTE — Telephone Encounter (Signed)
Paperwork placed in folder for signature

## 2020-10-30 NOTE — Telephone Encounter (Signed)
Front desk notified patient that forms were ready for pick up.

## 2020-10-31 ENCOUNTER — Other Ambulatory Visit: Payer: Self-pay | Admitting: Nurse Practitioner

## 2020-10-31 NOTE — Telephone Encounter (Signed)
Requested Prescriptions  Pending Prescriptions Disp Refills  . rOPINIRole (REQUIP) 0.25 MG tablet [Pharmacy Med Name: ROPINIROLE HCL 0.25 MG TABLET] 180 tablet 0    Sig: TAKE 1 TABLET (0.25 MG TOTAL) BY MOUTH AT BEDTIME. TAKE 1 TABLET EVERY NIGHT FOR 2 DAYS, THEN TAKE 2 TABLETS EVERY NIGHT     Neurology:  Parkinsonian Agents Passed - 10/31/2020  1:26 AM      Passed - Last BP in normal range    BP Readings from Last 1 Encounters:  10/03/20 118/83         Passed - Valid encounter within last 12 months    Recent Outpatient Visits          1 week ago Chronic right-sided low back pain with right-sided sciatica   [facility] [place], Jolene T, NP   4 weeks ago Chronic right-sided low back pain with right-sided sciatica   [facility] [place], Lauren A, NP   5 months ago Cough   [facility] [place], [place] T, NP   6 months ago Cough   [facility] [place], [place] T, NP   10 months ago Herpes zoster without complication   [facility] [place], Dorie Rank, NP      Future Appointments            In 2 weeks [place], Dorie Rank, NP [employer], [facility]

## 2020-11-02 NOTE — Telephone Encounter (Signed)
Pt stated he will be there at 4:15pm to pick up the forms, pt also wanted to know if the [facility] paperwork has been faxed or if he still needs to complete anything further. FYI

## 2020-11-20 ENCOUNTER — Encounter: Payer: Commercial Managed Care - PPO | Admitting: Nurse Practitioner

## 2021-05-07 ENCOUNTER — Telehealth: Payer: Commercial Managed Care - PPO | Admitting: Physician Assistant

## 2021-05-07 DIAGNOSIS — A084 Viral intestinal infection, unspecified: Secondary | ICD-10-CM

## 2021-05-07 DIAGNOSIS — R058 Other specified cough: Secondary | ICD-10-CM | POA: Diagnosis not present

## 2021-05-07 MED ORDER — ONDANSETRON 4 MG PO TBDP: 4.0000 mg | ORAL_TABLET | Freq: Three times a day (TID) | ORAL | 0 refills | Status: AC | PRN

## 2021-05-07 MED ORDER — BENZONATATE 100 MG PO CAPS: 100.0000 mg | ORAL_CAPSULE | Freq: Three times a day (TID) | ORAL | 0 refills | Status: DC | PRN

## 2021-05-07 NOTE — Patient Instructions (Signed)
Katrine Coho, thank you for joining [name], PA-C for today's virtual visit.  While this provider is not your primary care provider (PCP), if your PCP is located in our provider database this encounter information will be shared with them immediately following your visit.  Consent: (Patient) Katrine Coho provided verbal consent for this virtual visit at the beginning of the encounter.  Current Medications:  Current Outpatient Medications:    benzonatate (TESSALON) 100 MG capsule, Take 1 capsule (100 mg total) by mouth 3 (three) times daily as needed for cough., Disp: 30 capsule, Rfl: 0   ondansetron (ZOFRAN ODT) 4 MG disintegrating tablet, Take 1 tablet (4 mg total) by mouth every 8 (eight) hours as needed for nausea or vomiting., Disp: 20 tablet, Rfl: 0   albuterol (VENTOLIN HFA) 108 (90 Base) MCG/ACT inhaler, Inhale 2 puffs into the lungs every 6 (six) hours as needed for wheezing or shortness of breath. (Patient not taking: Reported on 10/20/2020), Disp: 8 g, Rfl: 2   amLODipine (NORVASC) 5 MG tablet, TAKE 1 TABLET BY MOUTH EVERY DAY, Disp: 90 tablet, Rfl: 0   cetirizine (ZYRTEC) 10 MG tablet, Take 10 mg by mouth daily., Disp: , Rfl:    gabapentin (NEURONTIN) 300 MG capsule, Take 2 capsules (600 mg total) by mouth at bedtime., Disp: 180 capsule, Rfl: 4   ibuprofen (ADVIL) 800 MG tablet, Take 1 tablet (800 mg total) by mouth every 8 (eight) hours as needed., Disp: 30 tablet, Rfl: 0   rOPINIRole (REQUIP) 0.25 MG tablet, TAKE 1 TABLET (0.25 MG TOTAL) BY MOUTH AT BEDTIME. TAKE 1 TABLET EVERY NIGHT FOR 2 DAYS, THEN TAKE 2 TABLETS EVERY NIGHT, Disp: 180 tablet, Rfl: 0   valACYclovir (VALTREX) 1000 MG tablet, Take 1 tablet (1,000 mg total) by mouth daily., Disp: 90 tablet, Rfl: 4   Medications ordered in this encounter:  Meds ordered this encounter  Medications   ondansetron (ZOFRAN ODT) 4 MG disintegrating tablet    Sig: Take 1 tablet (4 mg total) by mouth every 8 (eight) hours  as needed for nausea or vomiting.    Dispense:  20 tablet    Refill:  0    Order Specific Question:   Supervising Provider    Answer:   MILLER, BRIAN [[id number]]   benzonatate (TESSALON) 100 MG capsule    Sig: Take 1 capsule (100 mg total) by mouth 3 (three) times daily as needed for cough.    Dispense:  30 capsule    Refill:  0    Order Specific Question:   Supervising Provider    Answer:   Hyacinth Meeker, BRIAN [[id number]]     *If you need refills on other medications prior to your next appointment, please contact your pharmacy*  Follow-Up: Call back or seek an in-person evaluation if the symptoms worsen or if the condition fails to improve as anticipated.  Other Instructions Please take medications as directed to control nausea and vomiting as well as residual cough.  Please read dietary instructions below. Start OTC immodium for diarrhea.   If anything worsens or you are unable to keep fluids in, you need to be seen at nearest ET. Please do not delay care.   Bland Diet A bland diet consists of foods that are often soft and do not have a lot of fat, fiber, or extra seasonings. Foods without fat, fiber, or seasoning are easier for the body to digest. They are also less likely to irritate your mouth, throat, stomach, and other  parts of your digestive system. A bland diet is sometimes called a BRAT diet. What is my plan? Your health care provider or food and nutrition specialist (dietitian) may recommend specific changes to your diet to prevent symptoms or to treat your symptoms. These changes may include: Eating small meals often. Cooking food until it is soft enough to chew easily. Chewing your food well. Drinking fluids slowly. Not eating foods that are very spicy, sour, or fatty. Not eating citrus fruits, such as oranges and grapefruit. What do I need to know about this diet? Eat a variety of foods from the bland diet food list. Do not follow a bland diet longer than needed. Ask your health  care provider whether you should take vitamins or supplements. What foods can I eat? Grains Hot cereals, such as cream of wheat. Rice. Bread, crackers, or tortillas made from refined white flour. Vegetables Canned or cooked vegetables. Mashed or boiled potatoes. Fruits Bananas. Applesauce. Other types of cooked or canned fruit with the skin and seeds removed, such as canned peaches or pears. Meats and other proteins Scrambled eggs. Creamy peanut butter or other nut butters. Lean, well-cooked meats, such as chicken or fish. Tofu. Soups or broths. Dairy Low-fat dairy products, such as milk, cottage cheese, or yogurt. Beverages Water. Herbal tea. Apple juice. Fats and oils Mild salad dressings. Canola or olive oil. Sweets and desserts Pudding. Custard. Fruit gelatin. Ice cream. The items listed above may not be a complete list of recommended foods and beverages. Contact a dietitian for more options. What foods are not recommended? Grains Whole grain breads and cereals. Vegetables Raw vegetables. Fruits Raw fruits, especially citrus, berries, or dried fruits. Dairy Whole fat dairy foods. Beverages Caffeinated drinks. Alcohol. Seasonings and condiments Strongly flavored seasonings or condiments. Hot sauce. Salsa. Other foods Spicy foods. Fried foods. Sour foods, such as pickled or fermented foods. Foods with high sugar content. Foods high in fiber. The items listed above may not be a complete list of foods and beverages to avoid. Contact a dietitian for more information. Summary A bland diet consists of foods that are often soft and do not have a lot of fat, fiber, or extra seasonings. Foods without fat, fiber, or seasoning are easier for the body to digest. Check with your health care provider to see how long you should follow this diet plan. It is not meant to be followed for long periods. This information is not intended to replace advice given to you by your health care  provider. Make sure you discuss any questions you have with your health care provider. Document Revised: 07/16/2017 Document Reviewed: 07/16/2017 Elsevier Patient Education  2022 [employer].    If you have been instructed to have an in-person evaluation today at a local Urgent Care facility, please use the link below. It will take you to a list of all of our available [facility], including address, phone number and hours of operation. Please do not delay care.  [facility]  If you or a family member do not have a primary care provider, use the link below to schedule a visit and establish care. When you choose a [facility] primary care physician or advanced practice provider, you gain a long-term partner in health. Find a Primary Care Provider  Learn more about [facility] in-office and virtual care options: [facility] Get Care Now

## 2021-05-07 NOTE — Progress Notes (Signed)
Virtual Visit Consent   Rodney Higgins, you are scheduled for a virtual visit with a [facility] provider today.     Just as with appointments in the office, your consent must be obtained to participate.  Your consent will be active for this visit and any virtual visit you may have with one of our providers in the next 365 days.     If you have a MyChart account, a copy of this consent can be sent to you electronically.  All virtual visits are billed to your insurance company just like a traditional visit in the office.    As this is a virtual visit, video technology does not allow for your provider to perform a traditional examination.  This may limit your provider's ability to fully assess your condition.  If your provider identifies any concerns that need to be evaluated in person or the need to arrange testing (such as labs, EKG, etc.), we will make arrangements to do so.     Although advances in technology are sophisticated, we cannot ensure that it will always work on either your end or our end.  If the connection with a video visit is poor, the visit may have to be switched to a telephone visit.  With either a video or telephone visit, we are not always able to ensure that we have a secure connection.     I need to obtain your verbal consent now.   Are you willing to proceed with your visit today?    Rodney Higgins has provided verbal consent on 05/07/2021 for a virtual visit (video or telephone).   [name], [place]   Date: 05/07/2021 8:59 AM   Virtual Visit via Video Note   I, [name], connected with  Rodney Higgins  ([id number], July 28, 1975) on 05/07/21 at  8:45 AM EST by a video-enabled telemedicine application and verified that I am speaking with the correct person using two identifiers.  Location: Patient: Virtual Visit Location Patient: Home Provider: Virtual Visit Location Provider: Home Office   I discussed the limitations of evaluation and management  by telemedicine and the availability of in person appointments. The patient expressed understanding and agreed to proceed.    History of Present Illness: Rodney Higgins is a 45 y.o. who identifies as a male who was assigned male at birth, and is being seen today for residual dry cough after being diagnosed the week before with influenza. Notes initially with fever, aches, chills that have all resolved. Head congestion much improved. Left with lingering but frequent dry cough. Over the past 3 days is having non-bloody diarrhea and vomiting. Denies melena, hematochezia or tenesmus. Denies fever. Chills, aches. Good urinary output but is a darker yellow now.   HPI: HPI  Problems:  Patient Active Problem List   Diagnosis Date Noted  . Chronic right-sided low back pain with right-sided sciatica 10/03/2020  . Elevated LFTs 05/15/2020  . Restless leg syndrome 05/01/2020  . Polyp of sigmoid colon   . Osteoarthritis of left knee 11/16/2019  . Family history of prostate cancer 11/16/2019  . Family history of colon cancer 11/16/2019  . Raynaud phenomenon 10/19/2019  . Nicotine dependence, cigarettes, uncomplicated 10/19/2019  . Heart murmur 10/19/2019    Allergies:  Allergies  Allergen Reactions  . Penicillins Hives   Medications:  Current Outpatient Medications:  .  benzonatate (TESSALON) 100 MG capsule, Take 1 capsule (100 mg total) by mouth 3 (three) times daily as needed  for cough., Disp: 30 capsule, Rfl: 0 .  ondansetron (ZOFRAN ODT) 4 MG disintegrating tablet, Take 1 tablet (4 mg total) by mouth every 8 (eight) hours as needed for nausea or vomiting., Disp: 20 tablet, Rfl: 0 .  albuterol (VENTOLIN HFA) 108 (90 Base) MCG/ACT inhaler, Inhale 2 puffs into the lungs every 6 (six) hours as needed for wheezing or shortness of breath. (Patient not taking: Reported on 10/20/2020), Disp: 8 g, Rfl: 2 .  amLODipine (NORVASC) 5 MG tablet, TAKE 1 TABLET BY MOUTH EVERY DAY, Disp: 90 tablet, Rfl: 0 .   cetirizine (ZYRTEC) 10 MG tablet, Take 10 mg by mouth daily., Disp: , Rfl:  .  gabapentin (NEURONTIN) 300 MG capsule, Take 2 capsules (600 mg total) by mouth at bedtime., Disp: 180 capsule, Rfl: 4 .  ibuprofen (ADVIL) 800 MG tablet, Take 1 tablet (800 mg total) by mouth every 8 (eight) hours as needed., Disp: 30 tablet, Rfl: 0 .  rOPINIRole (REQUIP) 0.25 MG tablet, TAKE 1 TABLET (0.25 MG TOTAL) BY MOUTH AT BEDTIME. TAKE 1 TABLET EVERY NIGHT FOR 2 DAYS, THEN TAKE 2 TABLETS EVERY NIGHT, Disp: 180 tablet, Rfl: 0 .  valACYclovir (VALTREX) 1000 MG tablet, Take 1 tablet (1,000 mg total) by mouth daily., Disp: 90 tablet, Rfl: 4  Observations/Objective: Patient is well-developed, well-nourished in no acute distress.  Resting comfortably at home.  Head is normocephalic, atraumatic.  No labored breathing. Speech is clear and coherent with logical content.  Patient is alert and oriented at baseline.   Assessment and Plan: 1. Viral gastroenteritis - ondansetron (ZOFRAN ODT) 4 MG disintegrating tablet; Take 1 tablet (4 mg total) by mouth every 8 (eight) hours as needed for nausea or vomiting.  Dispense: 20 tablet; Refill: 0 Start Zofran to help stop emesis. This way he can hydrate and get medications in to help with cough. Can start Immodium OTC. BRAT diet reviewed. Strict ER precautions reviewed with patient.   2. Post-viral cough syndrome - benzonatate (TESSALON) 100 MG capsule; Take 1 capsule (100 mg total) by mouth 3 (three) times daily as needed for cough.  Dispense: 30 capsule; Refill: 0 Residual cough after influenza. No signs or symptoms ofd secondary bacterial URI. Supportive measures reviewed. Rx Tessalon for cough.   Follow Up Instructions: I discussed the assessment and treatment plan with the patient. The patient was provided an opportunity to ask questions and all were answered. The patient agreed with the plan and demonstrated an understanding of the instructions.  A copy of instructions  were sent to the patient via MyChart unless otherwise noted below.   The patient was advised to call back or seek an in-person evaluation if the symptoms worsen or if the condition fails to improve as anticipated.  Time:  I spent 15 minutes with the patient via telehealth technology discussing the above problems/concerns.    [name], PA-C

## 2021-05-15 ENCOUNTER — Other Ambulatory Visit: Payer: Self-pay

## 2021-05-15 ENCOUNTER — Encounter: Payer: Self-pay | Admitting: Nurse Practitioner

## 2021-05-15 ENCOUNTER — Ambulatory Visit: Payer: Commercial Managed Care - PPO | Admitting: Nurse Practitioner

## 2021-05-15 VITALS — BP 93/70 | HR 96 | Temp 98.2°F | Wt 161.4 lb

## 2021-05-15 DIAGNOSIS — R634 Abnormal weight loss: Secondary | ICD-10-CM | POA: Diagnosis not present

## 2021-05-15 DIAGNOSIS — R1011 Right upper quadrant pain: Secondary | ICD-10-CM | POA: Insufficient documentation

## 2021-05-15 MED ORDER — CYCLOBENZAPRINE HCL 10 MG PO TABS
10.0000 mg | ORAL_TABLET | Freq: Three times a day (TID) | ORAL | 1 refills | Status: DC | PRN
Start: 2021-05-15 — End: 2021-06-08

## 2021-05-15 NOTE — Patient Instructions (Signed)
Abdominal Pain, Adult Many things can cause belly (abdominal) pain. Most times, belly pain is not dangerous. Many cases of belly pain can be watched and treated at home. Sometimes, though, belly pain is serious. Yourdoctor will try to find the cause of your belly pain. Follow these instructions at home:  Medicines Take over-the-counter and prescription medicines only as told by your doctor. Do not take medicines that help you poop (laxatives) unless told by your doctor. General instructions Watch your belly pain for any changes. Drink enough fluid to keep your pee (urine) pale yellow. Keep all follow-up visits as told by your doctor. This is important. Contact a doctor if: Your belly pain changes or gets worse. You are not hungry, or you lose weight without trying. You are having trouble pooping (constipated) or have watery poop (diarrhea) for more than 2-3 days. You have pain when you pee or poop. Your belly pain wakes you up at night. Your pain gets worse with meals, after eating, or with certain foods. You are vomiting and cannot keep anything down. You have a fever. You have blood in your pee. Get help right away if: Your pain does not go away as soon as your doctor says it should. You cannot stop vomiting. Your pain is only in areas of your belly, such as the right side or the left lower part of the belly. You have bloody or black poop, or poop that looks like tar. You have very bad pain, cramping, or bloating in your belly. You have signs of not having enough fluid or water in your body (dehydration), such as: Dark pee, very little pee, or no pee. Cracked lips. Dry mouth. Sunken eyes. Sleepiness. Weakness. You have trouble breathing or chest pain. Summary Many cases of belly pain can be watched and treated at home. Watch your belly pain for any changes. Take over-the-counter and prescription medicines only as told by your doctor. Contact a doctor if your belly pain  changes or gets worse. Get help right away if you have very bad pain, cramping, or bloating in your belly. This information is not intended to replace advice given to you by your health care provider. Make sure you discuss any questions you have with your healthcare provider. Document Revised: 10/26/2018 Document Reviewed: 10/26/2018 Elsevier Patient Education  2022 Elsevier Inc.  

## 2021-05-15 NOTE — Progress Notes (Signed)
BP 93/70   Pulse 96   Temp 98.2 F (36.8 C) (Oral)   Wt 161 lb 6.4 oz (73.2 kg)   SpO2 96%   BMI 24.54 kg/m    Subjective:    Patient ID: Rodney Higgins, male    DOB: 1975-07-23, 45 y.o.   MRN: [id number]  HPI: Rodney Higgins is a 45 y.o. male  Chief Complaint  Patient presents with   Abdominal Pain    Patient states he is still having the pain in right lower abdomen. Patient states he even if he is driving and has to sneeze he will stop and pull over to take a second. Patient states when he has the pain it feels like cramp and states it feels hard in that area compare to the his left side. Patient states the pain will radiate to his back and radiate down his sciatic nerve.    Emesis    Patient states Dr.Williams from the virtual visit prescribed him some Zofran to help and states it has helped him a little.    ABDOMINAL PAIN  Has been present daily since April with worsening.  Right mid-quadrant area.  If coughs or moves certain way there area cramps up -- if is driving will have to stop and pull over if coughs or sneezes.  Pain radiates around to back, like sciatic pain.  Recently was given Zofran at virtual visit on 05/07/21, which he reports helps some. Having 3-4 bowel movements a day, soon as he eats the food with go right through him.  Chicken soup and crackers stays without going right through.  Had colonoscopy last 01/10/2020 with repeat recommended in 3 years, has family history of colon cancer. Still has gall bladder in place and appendix.  Does use alcohol at home, 3-4 beers a daily.  Continues to smoke cigarettes, 6 cigarettes a day.  On city water at home. Duration:months Onset: gradual Severity: 8/10 at worst and 2/10 daily Quality: aching, cramping, and throbbing Location:   to right mid quadrant and diffuse  Episode duration: sharp episodes last 30 minutes to one hour Radiation: no Frequency: constant Alleviating factors: stretching Aggravating factors:  sneezing, coughing, spicy foods Status: worse Treatments attempted: antacids & Ibuprofen-- help some Fever: no Nausea: yes Vomiting: yes Weight loss: yes Decreased appetite: yes Diarrhea: yes -- tan colored stool Constipation: no Blood in stool:  at times stool is really dark Heartburn: no Jaundice: no Rash: no Dysuria/urinary frequency: no Hematuria: no History of sexually transmitted disease: no Recurrent NSAID use: has been using more, which has been helping more with pain  Relevant past medical, surgical, family and social history reviewed and updated as indicated. Interim medical history since our last visit reviewed. Allergies and medications reviewed and updated.  Review of Systems  Constitutional:  Positive for appetite change, fatigue and unexpected weight change. Negative for activity change, diaphoresis and fever.  Respiratory:  Negative for cough, chest tightness, shortness of breath and wheezing.   Cardiovascular:  Negative for chest pain, palpitations and leg swelling.  Gastrointestinal:  Positive for abdominal pain, diarrhea, nausea and vomiting. Negative for abdominal distention and constipation.  Endocrine: Negative for cold intolerance, heat intolerance, polydipsia, polyphagia and polyuria.  Musculoskeletal: Negative.   Skin: Negative.   Neurological: Negative.   Psychiatric/Behavioral: Negative.     Per HPI unless specifically indicated above     Objective:    BP 93/70   Pulse 96   Temp 98.2 F (36.8 C) (Oral)  Wt 161 lb 6.4 oz (73.2 kg)   SpO2 96%   BMI 24.54 kg/m   Wt Readings from Last 3 Encounters:  05/15/21 161 lb 6.4 oz (73.2 kg)  10/03/20 170 lb 9.6 oz (77.4 kg)  05/15/20 170 lb 9.6 oz (77.4 kg)    Physical Exam Vitals and nursing note reviewed.  Constitutional:      General: He is awake. He is not in acute distress.    Appearance: He is well-developed and well-groomed. He is not ill-appearing or toxic-appearing.  HENT:     Head:  Normocephalic and atraumatic.     Right Ear: Hearing normal. No drainage.     Left Ear: Hearing normal. No drainage.  Eyes:     General: Lids are normal.        Right eye: No discharge.        Left eye: No discharge.     Conjunctiva/sclera: Conjunctivae normal.     Pupils: Pupils are equal, round, and reactive to light.  Neck:     Thyroid: No thyromegaly.     Vascular: No carotid bruit.  Cardiovascular:     Rate and Rhythm: Normal rate and regular rhythm.     Heart sounds: Normal heart sounds, S1 normal and S2 normal. No murmur heard.   No gallop.  Pulmonary:     Effort: Pulmonary effort is normal. No accessory muscle usage or respiratory distress.     Breath sounds: Normal breath sounds.  Abdominal:     General: Bowel sounds are normal. There is no distension.     Palpations: Abdomen is soft.     Tenderness: There is abdominal tenderness in the right upper quadrant and right lower quadrant. There is no right CVA tenderness, left CVA tenderness, guarding or rebound. Positive signs include psoas sign. Negative signs include Murphy's sign and McBurney's sign.     Hernia: No hernia is present.     Comments: Upper left quadrant firmer to palpate on exam compared to right.  Exquisitely tender RUQ and mid quadrant area right side.  Musculoskeletal:        General: Normal range of motion.     Cervical back: Normal range of motion and neck supple.     Right lower leg: No edema.     Left lower leg: No edema.  Lymphadenopathy:     Cervical: No cervical adenopathy.  Skin:    General: Skin is warm and dry.     Capillary Refill: Capillary refill takes less than 2 seconds.     Findings: No rash.  Neurological:     Mental Status: He is alert and oriented to person, place, and time.     Deep Tendon Reflexes: Reflexes are normal and symmetric.  Psychiatric:        Attention and Perception: Attention normal.        Mood and Affect: Mood normal.        Speech: Speech normal.        Behavior:  Behavior normal. Behavior is cooperative.        Thought Content: Thought content normal.    Results for orders placed or performed in visit on 05/31/20  Hepatic function panel  Result Value Ref Range   Total Protein 7.2 6.0 - 8.5 g/dL   Albumin 4.7 4.0 - 5.0 g/dL   Bilirubin Total 0.3 0.0 - 1.2 mg/dL   Bilirubin, Direct 0.13 0.00 - 0.40 mg/dL   Alkaline Phosphatase 92 44 - 121 IU/L   AST  26 0 - 40 IU/L   ALT 33 0 - 44 IU/L      Assessment & Plan:   Problem List Items Addressed This Visit       Other   RUQ abdominal pain - Primary    Ongoing for several months with worsening and 9 pounds weight loss.  Family history of colon cancer + current every day smoker and alcohol use.  At this time will obtain imaging STAT CT abdomen and pelvis + obtain labs today to include CBC, CMP, TSH, ANA, CRP, ESR, Lipase, GGT, and Amylase + A1c.  Script sent for Flexeril to help with discomfort and recommend heating pad as needed.  Recommend he cut back on smoking and alcohol use.  Will determine next steps upon return of imaging, suspect will need GI referral.  Return in one week.      Relevant Orders   CT Abdomen Pelvis Wo Contrast   CBC with Differential/Platelet   Comprehensive metabolic panel   TSH   Gamma GT   Lipase   Amylase   Sedimentation rate   C-reactive protein   ANA w/Reflex if Positive   Vitamin B12   Urinalysis, Routine w reflex microscopic   Unintended weight loss    9 pound loss and decreased appetite with abdominal pain, refer to RUQ abdominal pain plan of care for further.      Relevant Orders   CT Abdomen Pelvis Wo Contrast   CBC with Differential/Platelet   Comprehensive metabolic panel   TSH   Gamma GT   Lipase   Amylase   Sedimentation rate   C-reactive protein   ANA w/Reflex if Positive   Vitamin B12   HgB A1c    Time: 25 minutes, >50% spent counseling/or care coordination   Follow up plan: Return in about 1 week (around 05/22/2021) for abdominal pain.

## 2021-05-15 NOTE — Assessment & Plan Note (Signed)
Ongoing for several months with worsening and 9 pounds weight loss.  Family history of colon cancer + current every day smoker and alcohol use.  At this time will obtain imaging STAT CT abdomen and pelvis + obtain labs today to include CBC, CMP, TSH, ANA, CRP, ESR, Lipase, GGT, and Amylase + A1c.  Script sent for Flexeril to help with discomfort and recommend heating pad as needed.  Recommend he cut back on smoking and alcohol use.  Will determine next steps upon return of imaging, suspect will need GI referral.  Return in one week.

## 2021-05-15 NOTE — Assessment & Plan Note (Signed)
9 pound loss and decreased appetite with abdominal pain, refer to RUQ abdominal pain plan of care for further.

## 2021-05-16 ENCOUNTER — Telehealth: Payer: Self-pay | Admitting: Nurse Practitioner

## 2021-05-16 ENCOUNTER — Ambulatory Visit
Admission: RE | Admit: 2021-05-16 | Discharge: 2021-05-16 | Disposition: A | Discharge status: Home / Self Care | Payer: Commercial Managed Care - PPO | Source: Ambulatory Visit | Attending: Nurse Practitioner | Admitting: Nurse Practitioner

## 2021-05-16 DIAGNOSIS — R1011 Right upper quadrant pain: Secondary | ICD-10-CM | POA: Insufficient documentation

## 2021-05-16 DIAGNOSIS — R634 Abnormal weight loss: Secondary | ICD-10-CM

## 2021-05-16 LAB — URINALYSIS, ROUTINE W REFLEX MICROSCOPIC
Bilirubin, UA: NEGATIVE
Glucose, UA: NEGATIVE
Ketones, UA: NEGATIVE
Leukocytes,UA: NEGATIVE
Nitrite, UA: NEGATIVE
Protein,UA: NEGATIVE
RBC, UA: NEGATIVE
Specific Gravity, UA: 1.015 (ref 1.005–1.030)
Urobilinogen, Ur: 1 mg/dL (ref 0.2–1.0)
pH, UA: 5.5 (ref 5.0–7.5)

## 2021-05-16 LAB — COMPREHENSIVE METABOLIC PANEL
ALT: 49 IU/L — ABNORMAL HIGH (ref 0–44)
AST: 40 IU/L (ref 0–40)
Albumin/Globulin Ratio: 1.9 (ref 1.2–2.2)
Albumin: 5 g/dL (ref 4.0–5.0)
Alkaline Phosphatase: 72 IU/L (ref 44–121)
BUN/Creatinine Ratio: 7 — ABNORMAL LOW (ref 9–20)
BUN: 7 mg/dL (ref 6–24)
Bilirubin Total: 0.3 mg/dL (ref 0.0–1.2)
CO2: 25 mmol/L (ref 20–29)
Calcium: 9.8 mg/dL (ref 8.7–10.2)
Chloride: 104 mmol/L (ref 96–106)
Creatinine, Ser: 1.07 mg/dL (ref 0.76–1.27)
Globulin, Total: 2.6 g/dL (ref 1.5–4.5)
Glucose: 80 mg/dL (ref 70–99)
Potassium: 4.6 mmol/L (ref 3.5–5.2)
Sodium: 148 mmol/L — ABNORMAL HIGH (ref 134–144)
Total Protein: 7.6 g/dL (ref 6.0–8.5)
eGFR: 87 mL/min/{1.73_m2} (ref >59)

## 2021-05-16 LAB — CBC WITH DIFFERENTIAL/PLATELET
Basophils Absolute: 0.1 10*3/uL (ref 0.0–0.2)
Basos: 3 %
EOS (ABSOLUTE): 0.1 10*3/uL (ref 0.0–0.4)
Eos: 2 %
Hematocrit: 49.6 % (ref 37.5–51.0)
Hemoglobin: 17.1 g/dL (ref 13.0–17.7)
Immature Grans (Abs): 0 10*3/uL (ref 0.0–0.1)
Immature Granulocytes: 1 %
Lymphocytes Absolute: 1.8 10*3/uL (ref 0.7–3.1)
Lymphs: 42 %
MCH: 32.4 pg (ref 26.6–33.0)
MCHC: 34.5 g/dL (ref 31.5–35.7)
MCV: 94 fL (ref 79–97)
Monocytes Absolute: 0.4 10*3/uL (ref 0.1–0.9)
Monocytes: 9 %
Neutrophils Absolute: 1.9 10*3/uL (ref 1.4–7.0)
Neutrophils: 43 %
Platelets: 263 10*3/uL (ref 150–450)
RBC: 5.27 x10E6/uL (ref 4.14–5.80)
RDW: 13 % (ref 11.6–15.4)
WBC: 4.3 10*3/uL (ref 3.4–10.8)

## 2021-05-16 LAB — VITAMIN B12: Vitamin B-12: 472 pg/mL (ref 232–1245)

## 2021-05-16 LAB — SEDIMENTATION RATE: Sed Rate: 6 mm/hr (ref 0–15)

## 2021-05-16 LAB — AMYLASE: Amylase: 77 U/L (ref 31–110)

## 2021-05-16 LAB — HEMOGLOBIN A1C
Est. average glucose Bld gHb Est-mCnc: 111 mg/dL
Hgb A1c MFr Bld: 5.5 % (ref 4.8–5.6)

## 2021-05-16 LAB — LIPASE: Lipase: 65 U/L (ref 13–78)

## 2021-05-16 LAB — GAMMA GT: GGT: 149 IU/L — ABNORMAL HIGH (ref 0–65)

## 2021-05-16 LAB — ANA W/REFLEX IF POSITIVE: Anti Nuclear Antibody (ANA): NEGATIVE

## 2021-05-16 LAB — TSH: TSH: 0.826 u[IU]/mL (ref 0.450–4.500)

## 2021-05-16 LAB — C-REACTIVE PROTEIN: CRP: 2 mg/L (ref 0–10)

## 2021-05-16 MED ORDER — OMEPRAZOLE 20 MG PO CPDR: 20.0000 mg | DELAYED_RELEASE_CAPSULE | Freq: Every day | ORAL | 3 refills | Status: AC

## 2021-05-16 NOTE — Addendum Note (Signed)
Addended by: Aura Dials T on: 05/16/2021 07:31 PM   Modules accepted: Orders

## 2021-05-16 NOTE — Progress Notes (Signed)
Contacted via [place] morning [place], your labs have returned: - CBC shows no signs of infection or anemia - CMP shows mild elevation in sodium, definitely recommend reduce salt intake and increase water intake.  Kidney function, creatinine and eGFR, is normal.  Liver function, AST and ALT, shows mild elevation in ALT only.  GGT is mildly elevated at 149 -- this looks at gall bladder, we will see what CT scan shows today. - Thyroid lab is normal - Amylase and lipase which look at pancreas are normal - Inflammatory labs - CRP and ESR are normal.  I am waiting on ANA still. - A1c show no prediabetes or diabetes.  B12 level normal.  Urine normal.  Overall reassuring labs.   Any questions? Keep being amazing!!  Thank you for allowing me to participate in your care.  I appreciate you. Kindest regards, Marico Buckle

## 2021-05-16 NOTE — Telephone Encounter (Signed)
Spoke to patient on phone and reviewed imaging results with him, no findings to indicate cause of his RUQ pain.  There was a small cyst noted on left kidney, discussed with him.  Will place urgent referral to GI due to ongoing abdominal pain with weight loss and his family history of colon cancer.  Trial Prilosec 20 MG daily.

## 2021-05-16 NOTE — Telephone Encounter (Signed)
Pt dropped off FMLA paperwork to be completed by provider.  Pt was seen by PCP 05/15/2021 and was advised at this appointment to bring in documents today (11/16) and drop them off to be completed.  Please call patient at [phone] when documents are completed and pt will come back to sign what is needed.  Once this is done, pt has asked to fax documents to [facility] [place] at [phone].  Placed in provider's folder.

## 2021-05-18 NOTE — Telephone Encounter (Signed)
Patient and provider completed paperwork. Completed paperwork was faxed back over to [facility] at fax number listed in previous message. Faxed on 05/17/21.

## 2021-05-22 ENCOUNTER — Ambulatory Visit: Payer: Commercial Managed Care - PPO | Admitting: Nurse Practitioner

## 2021-05-22 ENCOUNTER — Other Ambulatory Visit: Payer: Self-pay

## 2021-05-22 ENCOUNTER — Encounter: Payer: Self-pay | Admitting: Nurse Practitioner

## 2021-05-22 VITALS — BP 115/77 | HR 103 | Temp 98.4°F | Wt 171.4 lb

## 2021-05-22 DIAGNOSIS — R1011 Right upper quadrant pain: Secondary | ICD-10-CM

## 2021-05-22 DIAGNOSIS — R634 Abnormal weight loss: Secondary | ICD-10-CM | POA: Diagnosis not present

## 2021-05-22 LAB — URINALYSIS, ROUTINE W REFLEX MICROSCOPIC
Bilirubin, UA: NEGATIVE
Glucose, UA: NEGATIVE
Ketones, UA: NEGATIVE
Leukocytes,UA: NEGATIVE
Nitrite, UA: NEGATIVE
Protein,UA: NEGATIVE
RBC, UA: NEGATIVE
Specific Gravity, UA: 1.02 (ref 1.005–1.030)
Urobilinogen, Ur: 1 mg/dL (ref 0.2–1.0)
pH, UA: 7 (ref 5.0–7.5)

## 2021-05-22 MED ORDER — DICYCLOMINE HCL 10 MG PO CAPS: 10.0000 mg | ORAL_CAPSULE | Freq: Three times a day (TID) | ORAL | 2 refills | Status: AC

## 2021-05-22 NOTE — Patient Instructions (Signed)
Abdominal Pain, Adult Many things can cause belly (abdominal) pain. Most times, belly pain is not dangerous. Many cases of belly pain can be watched and treated at home. Sometimes, though, belly pain is serious. Yourdoctor will try to find the cause of your belly pain. Follow these instructions at home:  Medicines Take over-the-counter and prescription medicines only as told by your doctor. Do not take medicines that help you poop (laxatives) unless told by your doctor. General instructions Watch your belly pain for any changes. Drink enough fluid to keep your pee (urine) pale yellow. Keep all follow-up visits as told by your doctor. This is important. Contact a doctor if: Your belly pain changes or gets worse. You are not hungry, or you lose weight without trying. You are having trouble pooping (constipated) or have watery poop (diarrhea) for more than 2-3 days. You have pain when you pee or poop. Your belly pain wakes you up at night. Your pain gets worse with meals, after eating, or with certain foods. You are vomiting and cannot keep anything down. You have a fever. You have blood in your pee. Get help right away if: Your pain does not go away as soon as your doctor says it should. You cannot stop vomiting. Your pain is only in areas of your belly, such as the right side or the left lower part of the belly. You have bloody or black poop, or poop that looks like tar. You have very bad pain, cramping, or bloating in your belly. You have signs of not having enough fluid or water in your body (dehydration), such as: Dark pee, very little pee, or no pee. Cracked lips. Dry mouth. Sunken eyes. Sleepiness. Weakness. You have trouble breathing or chest pain. Summary Many cases of belly pain can be watched and treated at home. Watch your belly pain for any changes. Take over-the-counter and prescription medicines only as told by your doctor. Contact a doctor if your belly pain  changes or gets worse. Get help right away if you have very bad pain, cramping, or bloating in your belly. This information is not intended to replace advice given to you by your health care provider. Make sure you discuss any questions you have with your healthcare provider. Document Revised: 10/26/2018 Document Reviewed: 10/26/2018 Elsevier Patient Education  2022 Elsevier Inc.  

## 2021-05-22 NOTE — Progress Notes (Signed)
BP 115/77   Pulse (!) 103   Temp 98.4 F (36.9 C) (Oral)   Wt 171 lb 6.4 oz (77.7 kg)   SpO2 97%   BMI 26.06 kg/m    Subjective:    Patient ID: Rodney Higgins, male    DOB: [birth year], 45 y.o.   MRN: [id number]  HPI: Rodney Higgins is a 45 y.o. male  Chief Complaint  Patient presents with   Abdominal Pain    Patient is here for follow up on Abdominal Pain. Patient states he still feels about the same and states he has not heard back from GI doctor yet for an appointment. Patient states he woke up this morning with pain and it is about 4-5 and states he is like a cramp-like feeling. Patient states the Flexeril helps him, but he notices it make him sleepy.    ABDOMINAL PAIN  Follow-up for abdominal pain.  CT showed no acute findings, did note a 13 mm low-density interpolar left kidney cyst.  Present daily since April with worsening.  Right mid-quadrant area.  Coughs or moves certain way there area cramps up -- if is driving will have to stop and pull over if coughs or sneezes.  Pain radiates around to back, like sciatic pain.  Last visit started Flexeril, helps but makes him sleepy.  Has gained 10 pounds since last visit, no loss.  Having 1-2 bowel movements a day, reports this has improved and not as loose as it was.  No further nausea.  Has a lot of gas all the time.    Had colonoscopy last 01/10/2020 with repeat recommended in 3 years, has family history of colon cancer. Still has gall bladder in place and appendix.    Does use alcohol at home, 3 beers a daily.  Continues to smoke cigarettes, 6 cigarettes a day.  On city water at home. Duration:months Onset: gradual Severity: 8/10 at worst and 2/10 daily Quality: aching, cramping, and throbbing Location:   to right mid quadrant and diffuse  Episode duration: sharp episodes last 30 minutes to one hour Radiation: no Frequency: constant Alleviating factors: stretching Aggravating factors: sneezing, coughing, spicy  foods Status: worse Treatments attempted: antacids & Ibuprofen-- help some Fever: no Nausea: yes Vomiting: yes Weight loss: yes Decreased appetite: yes Diarrhea: yes -- occasional tan colored stool Constipation: no Blood in stool: none Heartburn: no Jaundice: no Rash: no Dysuria/urinary frequency: no Hematuria: no History of sexually transmitted disease: no Recurrent NSAID use: has been using more, which has been helping more with pain  Relevant past medical, surgical, family and social history reviewed and updated as indicated. Interim medical history since our last visit reviewed. Allergies and medications reviewed and updated.  Review of Systems  Constitutional:  Positive for appetite change and fatigue. Negative for activity change, diaphoresis, fever and unexpected weight change.  Respiratory:  Negative for cough, chest tightness, shortness of breath and wheezing.   Cardiovascular:  Negative for chest pain, palpitations and leg swelling.  Gastrointestinal:  Positive for abdominal pain and nausea. Negative for abdominal distention, constipation, diarrhea and vomiting.  Endocrine: Negative for cold intolerance, heat intolerance, polydipsia, polyphagia and polyuria.  Musculoskeletal: Negative.   Skin: Negative.   Neurological: Negative.   Psychiatric/Behavioral: Negative.     Per HPI unless specifically indicated above     Objective:    BP 115/77   Pulse (!) 103   Temp 98.4 F (36.9 C) (Oral)   Wt 171 lb 6.4 oz (77.7 kg)  SpO2 97%   BMI 26.06 kg/m   Wt Readings from Last 3 Encounters:  05/22/21 171 lb 6.4 oz (77.7 kg)  05/15/21 161 lb 6.4 oz (73.2 kg)  10/03/20 170 lb 9.6 oz (77.4 kg)    Physical Exam Vitals and nursing note reviewed.  Constitutional:      General: He is awake. He is not in acute distress.    Appearance: He is well-developed and well-groomed. He is not ill-appearing or toxic-appearing.  HENT:     Head: Normocephalic and atraumatic.      Right Ear: Hearing normal. No drainage.     Left Ear: Hearing normal. No drainage.  Eyes:     General: Lids are normal.        Right eye: No discharge.        Left eye: No discharge.     Conjunctiva/sclera: Conjunctivae normal.     Pupils: Pupils are equal, round, and reactive to light.  Neck:     Thyroid: No thyromegaly.     Vascular: No carotid bruit.  Cardiovascular:     Rate and Rhythm: Normal rate and regular rhythm.     Heart sounds: Normal heart sounds, S1 normal and S2 normal. No murmur heard.   No gallop.  Pulmonary:     Effort: Pulmonary effort is normal. No accessory muscle usage or respiratory distress.     Breath sounds: Normal breath sounds.  Abdominal:     General: Bowel sounds are normal. There is no distension.     Palpations: Abdomen is soft.     Tenderness: There is abdominal tenderness in the right upper quadrant and right lower quadrant. There is no right CVA tenderness, left CVA tenderness, guarding or rebound. Negative signs include Murphy's sign, McBurney's sign and psoas sign.     Hernia: No hernia is present.     Comments: Upper left quadrant firmer to palpate on exam compared to right.  Exquisitely tender RUQ and mid quadrant area right side.  Musculoskeletal:        General: Normal range of motion.     Cervical back: Normal range of motion and neck supple.     Right lower leg: No edema.     Left lower leg: No edema.  Lymphadenopathy:     Cervical: No cervical adenopathy.  Skin:    General: Skin is warm and dry.     Capillary Refill: Capillary refill takes less than 2 seconds.     Findings: No rash.  Neurological:     Mental Status: He is alert and oriented to person, place, and time.     Deep Tendon Reflexes: Reflexes are normal and symmetric.  Psychiatric:        Attention and Perception: Attention normal.        Mood and Affect: Mood normal.        Speech: Speech normal.        Behavior: Behavior normal. Behavior is cooperative.        Thought  Content: Thought content normal.    Results for orders placed or performed in visit on 05/15/21  CBC with Differential/Platelet  Result Value Ref Range   WBC 4.3 3.4 - 10.8 x10E3/uL   RBC 5.27 4.14 - 5.80 x10E6/uL   Hemoglobin 17.1 13.0 - 17.7 g/dL   Hematocrit 49.6 37.5 - 51.0 %   MCV 94 79 - 97 fL   MCH 32.4 26.6 - 33.0 pg   MCHC 34.5 31.5 - 35.7 g/dL   RDW  13.0 11.6 - 15.4 %   Platelets 263 150 - 450 x10E3/uL   Neutrophils 43 Not Estab. %   Lymphs 42 Not Estab. %   Monocytes 9 Not Estab. %   Eos 2 Not Estab. %   Basos 3 Not Estab. %   Neutrophils Absolute 1.9 1.4 - 7.0 x10E3/uL   Lymphocytes Absolute 1.8 0.7 - 3.1 x10E3/uL   Monocytes Absolute 0.4 0.1 - 0.9 x10E3/uL   EOS (ABSOLUTE) 0.1 0.0 - 0.4 x10E3/uL   Basophils Absolute 0.1 0.0 - 0.2 x10E3/uL   Immature Granulocytes 1 Not Estab. %   Immature Grans (Abs) 0.0 0.0 - 0.1 x10E3/uL  Comprehensive metabolic panel  Result Value Ref Range   Glucose 80 70 - 99 mg/dL   BUN 7 6 - 24 mg/dL   Creatinine, Ser 1.07 0.76 - 1.27 mg/dL   eGFR 87 >59 mL/min/1.73   BUN/Creatinine Ratio 7 (L) 9 - 20   Sodium 148 (H) 134 - 144 mmol/L   Potassium 4.6 3.5 - 5.2 mmol/L   Chloride 104 96 - 106 mmol/L   CO2 25 20 - 29 mmol/L   Calcium 9.8 8.7 - 10.2 mg/dL   Total Protein 7.6 6.0 - 8.5 g/dL   Albumin 5.0 4.0 - 5.0 g/dL   Globulin, Total 2.6 1.5 - 4.5 g/dL   Albumin/Globulin Ratio 1.9 1.2 - 2.2   Bilirubin Total 0.3 0.0 - 1.2 mg/dL   Alkaline Phosphatase 72 44 - 121 IU/L   AST 40 0 - 40 IU/L   ALT 49 (H) 0 - 44 IU/L  TSH  Result Value Ref Range   TSH 0.826 0.450 - 4.500 uIU/mL  Gamma GT  Result Value Ref Range   GGT 149 (H) 0 - 65 IU/L  Lipase  Result Value Ref Range   Lipase 65 13 - 78 U/L  Amylase  Result Value Ref Range   Amylase 77 31 - 110 U/L  Sedimentation rate  Result Value Ref Range   Sed Rate 6 0 - 15 mm/hr  C-reactive protein  Result Value Ref Range   CRP 2 0 - 10 mg/L  ANA w/Reflex if Positive  Result Value  Ref Range   Anti Nuclear Antibody (ANA) Negative Negative  Vitamin B12  Result Value Ref Range   Vitamin B-12 472 232 - 1,245 pg/mL  Urinalysis, Routine w reflex microscopic  Result Value Ref Range   Specific Gravity, UA 1.015 1.005 - 1.030   pH, UA 5.5 5.0 - 7.5   Color, UA Yellow Yellow   Appearance Ur Clear Clear   Leukocytes,UA Negative Negative   Protein,UA Negative Negative/Trace   Glucose, UA Negative Negative   Ketones, UA Negative Negative   RBC, UA Negative Negative   Bilirubin, UA Negative Negative   Urobilinogen, Ur 1.0 0.2 - 1.0 mg/dL   Nitrite, UA Negative Negative  HgB A1c  Result Value Ref Range   Hgb A1c MFr Bld 5.5 4.8 - 5.6 %   Est. average glucose Bld gHb Est-mCnc 111 mg/dL      Assessment & Plan:   Problem List Items Addressed This Visit       Other   RUQ abdominal pain - Primary    Ongoing for several months with worsenin.  Family history of colon cancer + current every day smoker and alcohol use.  Recent imaging overall stable, could consider further imaging of left kidney cyst in future. Labs today CMP, BNP, Celiac panel, H. Pylori stool, urinalysis.  Continue Flexeril  to help with discomfort and recommend heating pad as needed.  Will add on Bentyl to assess if benefit with gas.  Recommend he cut back on smoking and alcohol use.  Referral placed last visit for GI, will check on this.  Return in 2 weeks.      Relevant Orders   H. pylori antigen, stool   Comprehensive metabolic panel   Celiac Disease Panel   Urinalysis, Routine w reflex microscopic   B Nat Peptide   Unintended weight loss    Has gained 10 pounds since last visit today, ?weight last visit, as weight today trends more with previous of 170 pounds.  GI referral last visit.      Relevant Orders   H. pylori antigen, stool      Follow up plan: Return in about 2 weeks (around 06/05/2021) for abd pain.

## 2021-05-22 NOTE — Assessment & Plan Note (Signed)
Ongoing for several months with worsenin.  Family history of colon cancer + current every day smoker and alcohol use.  Recent imaging overall stable, could consider further imaging of left kidney cyst in future. Labs today CMP, BNP, Celiac panel, H. Pylori stool, urinalysis.  Continue Flexeril to help with discomfort and recommend heating pad as needed.  Will add on Bentyl to assess if benefit with gas.  Recommend he cut back on smoking and alcohol use.  Referral placed last visit for GI, will check on this.  Return in 2 weeks.

## 2021-05-22 NOTE — Assessment & Plan Note (Signed)
Has gained 10 pounds since last visit today, ?weight last visit, as weight today trends more with previous of 170 pounds.  GI referral last visit.

## 2021-05-24 NOTE — Progress Notes (Signed)
Contacted via MyChart   Happy Thanksgiving Rodney Higgins, your labs are mostly returned, awaiting one more lab.  Kidney and liver function remain stable, however I continue to recommend cut back on alcohol use at home.  Celiac labs so far are negative.  Any questions? Keep being amazing!!  Thank you for allowing me to participate in your care.  I appreciate you. Kindest regards, Rashawn Rolon

## 2021-05-25 LAB — COMPREHENSIVE METABOLIC PANEL
ALT: 27 IU/L (ref 0–44)
AST: 30 IU/L (ref 0–40)
Albumin/Globulin Ratio: 1.8 (ref 1.2–2.2)
Albumin: 4.7 g/dL (ref 4.0–5.0)
Alkaline Phosphatase: 69 IU/L (ref 44–121)
BUN/Creatinine Ratio: 6 — ABNORMAL LOW (ref 9–20)
BUN: 7 mg/dL (ref 6–24)
Bilirubin Total: 0.3 mg/dL (ref 0.0–1.2)
CO2: 28 mmol/L (ref 20–29)
Calcium: 9.7 mg/dL (ref 8.7–10.2)
Chloride: 100 mmol/L (ref 96–106)
Creatinine, Ser: 1.13 mg/dL (ref 0.76–1.27)
Globulin, Total: 2.6 g/dL (ref 1.5–4.5)
Glucose: 83 mg/dL (ref 70–99)
Potassium: 4.3 mmol/L (ref 3.5–5.2)
Sodium: 144 mmol/L (ref 134–144)
Total Protein: 7.3 g/dL (ref 6.0–8.5)
eGFR: 82 mL/min/{1.73_m2} (ref >59)

## 2021-05-25 LAB — CELIAC DISEASE PANEL
Endomysial IgA: NEGATIVE
IgA/Immunoglobulin A, Serum: 355 mg/dL (ref 90–386)
Transglutaminase IgA: <2 U/mL (ref 0–3)

## 2021-06-05 ENCOUNTER — Ambulatory Visit: Payer: Commercial Managed Care - PPO | Admitting: Nurse Practitioner

## 2021-06-08 ENCOUNTER — Ambulatory Visit: Payer: Commercial Managed Care - PPO | Admitting: Nurse Practitioner

## 2021-06-08 ENCOUNTER — Other Ambulatory Visit: Payer: Self-pay

## 2021-06-08 ENCOUNTER — Encounter: Payer: Self-pay | Admitting: Nurse Practitioner

## 2021-06-08 VITALS — BP 108/75 | HR 94 | Temp 98.2°F | Wt 169.4 lb

## 2021-06-08 DIAGNOSIS — R1011 Right upper quadrant pain: Secondary | ICD-10-CM | POA: Diagnosis not present

## 2021-06-08 DIAGNOSIS — Z789 Other specified health status: Secondary | ICD-10-CM

## 2021-06-08 DIAGNOSIS — Z114 Encounter for screening for human immunodeficiency virus [HIV]: Secondary | ICD-10-CM

## 2021-06-08 DIAGNOSIS — F17210 Nicotine dependence, cigarettes, uncomplicated: Secondary | ICD-10-CM | POA: Diagnosis not present

## 2021-06-08 DIAGNOSIS — Z1159 Encounter for screening for other viral diseases: Secondary | ICD-10-CM

## 2021-06-08 MED ORDER — CYCLOBENZAPRINE HCL 10 MG PO TABS: 10.0000 mg | ORAL_TABLET | Freq: Three times a day (TID) | ORAL | 1 refills | Status: AC | PRN

## 2021-06-08 NOTE — Patient Instructions (Signed)
Abdominal Pain, Adult Many things can cause belly (abdominal) pain. Most times, belly pain is not dangerous. Many cases of belly pain can be watched and treated at home. Sometimes, though, belly pain is serious. Yourdoctor will try to find the cause of your belly pain. Follow these instructions at home:  Medicines Take over-the-counter and prescription medicines only as told by your doctor. Do not take medicines that help you poop (laxatives) unless told by your doctor. General instructions Watch your belly pain for any changes. Drink enough fluid to keep your pee (urine) pale yellow. Keep all follow-up visits as told by your doctor. This is important. Contact a doctor if: Your belly pain changes or gets worse. You are not hungry, or you lose weight without trying. You are having trouble pooping (constipated) or have watery poop (diarrhea) for more than 2-3 days. You have pain when you pee or poop. Your belly pain wakes you up at night. Your pain gets worse with meals, after eating, or with certain foods. You are vomiting and cannot keep anything down. You have a fever. You have blood in your pee. Get help right away if: Your pain does not go away as soon as your doctor says it should. You cannot stop vomiting. Your pain is only in areas of your belly, such as the right side or the left lower part of the belly. You have bloody or black poop, or poop that looks like tar. You have very bad pain, cramping, or bloating in your belly. You have signs of not having enough fluid or water in your body (dehydration), such as: Dark pee, very little pee, or no pee. Cracked lips. Dry mouth. Sunken eyes. Sleepiness. Weakness. You have trouble breathing or chest pain. Summary Many cases of belly pain can be watched and treated at home. Watch your belly pain for any changes. Take over-the-counter and prescription medicines only as told by your doctor. Contact a doctor if your belly pain  changes or gets worse. Get help right away if you have very bad pain, cramping, or bloating in your belly. This information is not intended to replace advice given to you by your health care provider. Make sure you discuss any questions you have with your healthcare provider. Document Revised: 10/26/2018 Document Reviewed: 10/26/2018 Elsevier Patient Education  2022 Elsevier Inc.  

## 2021-06-08 NOTE — Assessment & Plan Note (Signed)
Overall improved at this time with Flexeril, suspect more musculoskeletal, but will maintain collaboration with GI due to family history and risk factors - smoking and alcohol use.  Continue Flexeril as needed and Bentyl daily.  Labs today: CBC and CMP.  Return in May for physical.

## 2021-06-08 NOTE — Assessment & Plan Note (Signed)
I have recommended complete cessation of tobacco use. I have discussed various options available for assistance with tobacco cessation including over the counter methods (Nicotine gum, patch and lozenges). We also discussed prescription options (Chantix, Nicotine Inhaler / Nasal Spray). The patient is not interested in pursuing any prescription tobacco cessation options at this time.  

## 2021-06-08 NOTE — Assessment & Plan Note (Signed)
Recommend cut back on alcohol use, at length discussion.

## 2021-06-08 NOTE — Progress Notes (Signed)
BP 108/75   Pulse 94   Temp 98.2 F (36.8 C)   Wt 169 lb 6.4 oz (76.8 kg)   SpO2 96%   BMI 25.76 kg/m    Subjective:    Patient ID: Rodney Higgins, male    DOB: May 17, 1976, 45 y.o.   MRN: [id number]  HPI: Rodney Higgins is a 45 y.o. male  Chief Complaint  Patient presents with   Abdominal Pain    Patient is here for abdominal pain follow up. Patient states the Flexeril has helped a lot. Patient states he has not had any discomfort or pain in about a week.    ABDOMINAL PAIN  Follow-up for abdominal pain.  CT showed no acute findings, was noted to have a 13 mm low-density interpolar left kidney cyst.  Reports today that Flexeril is helping.  He was seen by GI on 05/31/21 for initial visit and is scheduled to have EGD on 08/24/21 -- he was told to restart Omeprazole 20 MG daily.  Denies any pain today, plans to return to work on Monday.  Has not had any pain since around 05/31/21.  Initially reported pain present daily since April.  Right mid-quadrant area.  Coughs or moves certain way there area cramps up -- if is driving will have to stop and pull over if coughs or sneezes.  Pain radiates around to back, like sciatic pain.    Having 1-2 bowel movements a day, reports this has improved and not as loose as it was.  No further nausea.  Has a lot of gas all the time.    Had colonoscopy last 01/10/2020 with repeat recommended in 3 years, has family history of colon cancer. Still has gall bladder in place and appendix.    Does use alcohol at home, 3 beers a daily + a couple shots of liquor.  Continues to smoke cigarettes, 6 cigarettes a day.   Duration:months Onset: gradual Severity: 0/10 at present Quality: aching, cramping, and throbbing Location:   to right mid quadrant and diffuse  Episode duration: sharp episodes last 30 minutes to one hour Radiation: no Frequency: constant Alleviating factors: stretching Aggravating factors: sneezing, coughing, spicy foods Status:  worse Treatments attempted: antacids & Ibuprofen-- help some Fever: no Nausea: none Vomiting: none Weight loss: none Decreased appetite: none Diarrhea: none Constipation: no Blood in stool: none Heartburn: no Jaundice: no Rash: no Dysuria/urinary frequency: no Hematuria: no History of sexually transmitted disease: no Recurrent NSAID use: has been using more, which has been helping more with pain  Relevant past medical, surgical, family and social history reviewed and updated as indicated. Interim medical history since our last visit reviewed. Allergies and medications reviewed and updated.  Review of Systems  Constitutional:  Negative for activity change, appetite change, diaphoresis, fatigue, fever and unexpected weight change.  Respiratory:  Negative for cough, chest tightness, shortness of breath and wheezing.   Cardiovascular:  Negative for chest pain, palpitations and leg swelling.  Gastrointestinal:  Negative for abdominal distention, abdominal pain, constipation, diarrhea, nausea and vomiting.  Endocrine: Negative for cold intolerance, heat intolerance, polydipsia, polyphagia and polyuria.  Neurological: Negative.   Psychiatric/Behavioral: Negative.     Per HPI unless specifically indicated above     Objective:    BP 108/75   Pulse 94   Temp 98.2 F (36.8 C)   Wt 169 lb 6.4 oz (76.8 kg)   SpO2 96%   BMI 25.76 kg/m   Wt Readings from Last 3 Encounters:  06/08/21 169 lb 6.4 oz (76.8 kg)  05/22/21 171 lb 6.4 oz (77.7 kg)  05/15/21 161 lb 6.4 oz (73.2 kg)    Physical Exam Vitals and nursing note reviewed.  Constitutional:      General: He is awake. He is not in acute distress.    Appearance: He is well-developed and well-groomed. He is not ill-appearing or toxic-appearing.  HENT:     Head: Normocephalic and atraumatic.     Right Ear: Hearing normal. No drainage.     Left Ear: Hearing normal. No drainage.  Eyes:     General: Lids are normal.        Right  eye: No discharge.        Left eye: No discharge.     Conjunctiva/sclera: Conjunctivae normal.     Pupils: Pupils are equal, round, and reactive to light.  Neck:     Thyroid: No thyromegaly.     Vascular: No carotid bruit.  Cardiovascular:     Rate and Rhythm: Normal rate and regular rhythm.     Heart sounds: Normal heart sounds, S1 normal and S2 normal. No murmur heard.   No gallop.  Pulmonary:     Effort: Pulmonary effort is normal. No accessory muscle usage or respiratory distress.     Breath sounds: Normal breath sounds.  Abdominal:     General: Bowel sounds are normal. There is no distension.     Palpations: Abdomen is soft.     Tenderness: There is no abdominal tenderness. There is no right CVA tenderness, left CVA tenderness, guarding or rebound. Negative signs include Murphy's sign, McBurney's sign and psoas sign.     Hernia: No hernia is present.     Comments: Upper right quadrant firmer to palpate on exam compared to left -- no tenderness on exam today.  Musculoskeletal:        General: Normal range of motion.     Cervical back: Normal range of motion and neck supple.     Right lower leg: No edema.     Left lower leg: No edema.  Lymphadenopathy:     Cervical: No cervical adenopathy.  Skin:    General: Skin is warm and dry.     Capillary Refill: Capillary refill takes less than 2 seconds.     Findings: No rash.  Neurological:     Mental Status: He is alert and oriented to person, place, and time.     Deep Tendon Reflexes: Reflexes are normal and symmetric.  Psychiatric:        Attention and Perception: Attention normal.        Mood and Affect: Mood normal.        Speech: Speech normal.        Behavior: Behavior normal. Behavior is cooperative.        Thought Content: Thought content normal.    Results for orders placed or performed in visit on 05/22/21  Comprehensive metabolic panel  Result Value Ref Range   Glucose 83 70 - 99 mg/dL   BUN 7 6 - 24 mg/dL    Creatinine, Ser 1.13 0.76 - 1.27 mg/dL   eGFR 82 >59 mL/min/1.73   BUN/Creatinine Ratio 6 (L) 9 - 20   Sodium 144 134 - 144 mmol/L   Potassium 4.3 3.5 - 5.2 mmol/L   Chloride 100 96 - 106 mmol/L   CO2 28 20 - 29 mmol/L   Calcium 9.7 8.7 - 10.2 mg/dL   Total Protein 7.3 6.0 -  8.5 g/dL   Albumin 4.7 4.0 - 5.0 g/dL   Globulin, Total 2.6 1.5 - 4.5 g/dL   Albumin/Globulin Ratio 1.8 1.2 - 2.2   Bilirubin Total 0.3 0.0 - 1.2 mg/dL   Alkaline Phosphatase 69 44 - 121 IU/L   AST 30 0 - 40 IU/L   ALT 27 0 - 44 IU/L  Celiac Disease Panel  Result Value Ref Range   Endomysial IgA Negative Negative   Transglutaminase IgA <2 0 - 3 U/mL   IgA/Immunoglobulin A, Serum 355 90 - 386 mg/dL  Urinalysis, Routine w reflex microscopic  Result Value Ref Range   Specific Gravity, UA 1.020 1.005 - 1.030   pH, UA 7.0 5.0 - 7.5   Color, UA Yellow Yellow   Appearance Ur Clear Clear   Leukocytes,UA Negative Negative   Protein,UA Negative Negative/Trace   Glucose, UA Negative Negative   Ketones, UA Negative Negative   RBC, UA Negative Negative   Bilirubin, UA Negative Negative   Urobilinogen, Ur 1.0 0.2 - 1.0 mg/dL   Nitrite, UA Negative Negative      Assessment & Plan:   Problem List Items Addressed This Visit       Other   Alcohol use    Recommend cut back on alcohol use, at length discussion.      Nicotine dependence, cigarettes, uncomplicated    I have recommended complete cessation of tobacco use. I have discussed various options available for assistance with tobacco cessation including over the counter methods (Nicotine gum, patch and lozenges). We also discussed prescription options (Chantix, Nicotine Inhaler / Nasal Spray). The patient is not interested in pursuing any prescription tobacco cessation options at this time.       RUQ abdominal pain - Primary    Overall improved at this time with Flexeril, suspect more musculoskeletal, but will maintain collaboration with GI due to family  history and risk factors - smoking and alcohol use.  Continue Flexeril as needed and Bentyl daily.  Labs today: CBC and CMP.  Return in May for physical.      Relevant Orders   Comprehensive metabolic panel   CBC with Differential/Platelet   Other Visit Diagnoses     Need for hepatitis C screening test       Hep C screening on labs today, discussed with patient.   Relevant Orders   Hepatitis C antibody   Encounter for screening for HIV       HIV screening on labs today, discussed with patient.   Relevant Orders   HIV Antibody (routine testing w rflx)         Follow up plan: Return in about 5 months (around 11/21/2021) for Annual physical.

## 2021-06-09 LAB — CBC WITH DIFFERENTIAL/PLATELET
Basophils Absolute: 0.1 10*3/uL (ref 0.0–0.2)
Basos: 2 %
EOS (ABSOLUTE): 0.1 10*3/uL (ref 0.0–0.4)
Eos: 2 %
Hematocrit: 48.5 % (ref 37.5–51.0)
Hemoglobin: 16.6 g/dL (ref 13.0–17.7)
Immature Grans (Abs): 0 10*3/uL (ref 0.0–0.1)
Immature Granulocytes: 0 %
Lymphocytes Absolute: 1.6 10*3/uL (ref 0.7–3.1)
Lymphs: 36 %
MCH: 32.1 pg (ref 26.6–33.0)
MCHC: 34.2 g/dL (ref 31.5–35.7)
MCV: 94 fL (ref 79–97)
Monocytes Absolute: 0.4 10*3/uL (ref 0.1–0.9)
Monocytes: 9 %
Neutrophils Absolute: 2.3 10*3/uL (ref 1.4–7.0)
Neutrophils: 51 %
Platelets: 245 10*3/uL (ref 150–450)
RBC: 5.17 x10E6/uL (ref 4.14–5.80)
RDW: 13.6 % (ref 11.6–15.4)
WBC: 4.5 10*3/uL (ref 3.4–10.8)

## 2021-06-09 LAB — COMPREHENSIVE METABOLIC PANEL
ALT: 57 IU/L — ABNORMAL HIGH (ref 0–44)
AST: 46 IU/L — ABNORMAL HIGH (ref 0–40)
Albumin/Globulin Ratio: 1.7 (ref 1.2–2.2)
Albumin: 4.7 g/dL (ref 4.0–5.0)
Alkaline Phosphatase: 83 IU/L (ref 44–121)
BUN/Creatinine Ratio: 11 (ref 9–20)
BUN: 12 mg/dL (ref 6–24)
Bilirubin Total: 0.3 mg/dL (ref 0.0–1.2)
CO2: 26 mmol/L (ref 20–29)
Calcium: 9.6 mg/dL (ref 8.7–10.2)
Chloride: 100 mmol/L (ref 96–106)
Creatinine, Ser: 1.06 mg/dL (ref 0.76–1.27)
Globulin, Total: 2.8 g/dL (ref 1.5–4.5)
Glucose: 76 mg/dL (ref 70–99)
Potassium: 4.7 mmol/L (ref 3.5–5.2)
Sodium: 140 mmol/L (ref 134–144)
Total Protein: 7.5 g/dL (ref 6.0–8.5)
eGFR: 88 mL/min/{1.73_m2} (ref >59)

## 2021-06-09 LAB — HEPATITIS C ANTIBODY: Hep C Virus Ab: <0.1 s/co ratio (ref 0.0–0.9)

## 2021-06-09 LAB — HIV ANTIBODY (ROUTINE TESTING W REFLEX): HIV Screen 4th Generation wRfx: NONREACTIVE

## 2021-06-10 NOTE — Progress Notes (Signed)
Contacted via [place] evening Rodney Higgins, your labs have returned.  Kidney function,creatinine and eGFR, is stable.  Liver function, AST and ALT, have trended up a little bit.  Please cut back on alcohol intake daily as this is effecting your liver.  Minimize Tylenol use too.  CBC shows no anemia or infection.  Hep C and HIV are negative.  Any questions? Keep being amazing!!  Thank you for allowing me to participate in your care.  I appreciate you. Kindest regards, Julius Matus

## 2021-06-12 ENCOUNTER — Ambulatory Visit: Payer: Commercial Managed Care - PPO | Admitting: Nurse Practitioner

## 2021-06-19 ENCOUNTER — Encounter: Payer: Self-pay | Admitting: Nurse Practitioner

## 2021-06-19 ENCOUNTER — Telehealth: Payer: Commercial Managed Care - PPO | Admitting: Physician Assistant

## 2021-06-19 NOTE — Progress Notes (Signed)
The patient no-showed for appointment despite this provider sending direct link, reaching out via phone with no response and waiting for at least 10 minutes from appointment time for patient to join. They will be marked as a NS for this appointment/time.  ? ?Montrell Cessna Cody Mendell Bontempo, PA-C ? ? ? ?

## 2021-06-20 ENCOUNTER — Telehealth: Payer: Commercial Managed Care - PPO | Admitting: Family

## 2021-06-20 DIAGNOSIS — R6889 Other general symptoms and signs: Secondary | ICD-10-CM | POA: Diagnosis not present

## 2021-06-20 MED ORDER — FLUTICASONE PROPIONATE 50 MCG/ACT NA SUSP
2.0000 | Freq: Every day | NASAL | 6 refills | Status: AC
Start: 2021-06-20 — End: ?

## 2021-06-20 MED ORDER — NAPROXEN 500 MG PO TABS: 500.0000 mg | ORAL_TABLET | Freq: Two times a day (BID) | ORAL | 0 refills | Status: AC

## 2021-06-20 NOTE — Progress Notes (Signed)
Virtual Visit Consent   Rodney Higgins, you are scheduled for a virtual visit with a [facility] provider today.     Just as with appointments in the office, your consent must be obtained to participate.  Your consent will be active for this visit and any virtual visit you may have with one of our providers in the next 365 days.     If you have a MyChart account, a copy of this consent can be sent to you electronically.  All virtual visits are billed to your insurance company just like a traditional visit in the office.    As this is a virtual visit, video technology does not allow for your provider to perform a traditional examination.  This may limit your provider's ability to fully assess your condition.  If your provider identifies any concerns that need to be evaluated in person or the need to arrange testing (such as labs, EKG, etc.), we will make arrangements to do so.     Although advances in technology are sophisticated, we cannot ensure that it will always work on either your end or our end.  If the connection with a video visit is poor, the visit may have to be switched to a telephone visit.  With either a video or telephone visit, we are not always able to ensure that we have a secure connection.     I need to obtain your verbal consent now.   Are you willing to proceed with your visit today?    Rodney Higgins has provided verbal consent on 06/20/2021 for a virtual visit (video or telephone).   Jannifer Rodney, FNP   Date: 06/20/2021 8:55 AM   Virtual Visit via Video Note   I, Jannifer Rodney, connected with  Rodney Higgins  ([id number], September 04, 1975) on 06/20/21 at  8:45 AM EST by a video-enabled telemedicine application and verified that I am speaking with the correct person using two identifiers.  Location: Patient: Virtual Visit Location Patient: Home Provider: Virtual Visit Location Provider: Home Office   I discussed the limitations of evaluation and management by  telemedicine and the availability of in person appointments. The patient expressed understanding and agreed to proceed.    History of Present Illness: Rodney Higgins is a 45 y.o. who identifies as a male who was assigned male at birth, and is being seen today for flu like symptoms that started 5 days ago.  HPI: Influenza This is a new problem. The current episode started in the past 7 days. The problem has been waxing and waning. Associated symptoms include chills, congestion, coughing, fatigue, a fever, headaches, myalgias and a sore throat. Pertinent negatives include no nausea or vomiting. Associated symptoms comments: Sinus pressure. He has tried acetaminophen, rest and NSAIDs for the symptoms. The treatment provided mild relief.   Problems:  Patient Active Problem List   Diagnosis Date Noted   Alcohol use 06/08/2021   RUQ abdominal pain 05/15/2021   Chronic right-sided low back pain with right-sided sciatica 10/03/2020   Elevated LFTs 05/15/2020   Restless leg syndrome 05/01/2020   Polyp of sigmoid colon    Osteoarthritis of left knee 11/16/2019   Family history of prostate cancer 11/16/2019   Family history of colon cancer 11/16/2019   Raynaud phenomenon 10/19/2019   Nicotine dependence, cigarettes, uncomplicated 10/19/2019   Heart murmur 10/19/2019    Allergies:  Allergies  Allergen Reactions   Penicillins Hives   Medications:  Current Outpatient Medications:  fluticasone (FLONASE) 50 MCG/ACT nasal spray, Place 2 sprays into both nostrils daily., Disp: 16 g, Rfl: 6   naproxen (NAPROSYN) 500 MG tablet, Take 1 tablet (500 mg total) by mouth 2 (two) times daily with a meal., Disp: 30 tablet, Rfl: 0   amLODipine (NORVASC) 5 MG tablet, TAKE 1 TABLET BY MOUTH EVERY DAY, Disp: 90 tablet, Rfl: 0   cetirizine (ZYRTEC) 10 MG tablet, Take 10 mg by mouth daily., Disp: , Rfl:    cyclobenzaprine (FLEXERIL) 10 MG tablet, Take 1 tablet (10 mg total) by mouth 3 (three) times daily as  needed for muscle spasms., Disp: 90 tablet, Rfl: 1   dicyclomine (BENTYL) 10 MG capsule, Take 1 capsule (10 mg total) by mouth 4 (four) times daily -  before meals and at bedtime., Disp: 90 capsule, Rfl: 2   gabapentin (NEURONTIN) 300 MG capsule, Take 2 capsules (600 mg total) by mouth at bedtime., Disp: 180 capsule, Rfl: 4   ibuprofen (ADVIL) 800 MG tablet, Take 1 tablet (800 mg total) by mouth every 8 (eight) hours as needed., Disp: 30 tablet, Rfl: 0   omeprazole (PRILOSEC) 20 MG capsule, Take 1 capsule (20 mg total) by mouth daily., Disp: 30 capsule, Rfl: 3   ondansetron (ZOFRAN ODT) 4 MG disintegrating tablet, Take 1 tablet (4 mg total) by mouth every 8 (eight) hours as needed for nausea or vomiting., Disp: 20 tablet, Rfl: 0   valACYclovir (VALTREX) 1000 MG tablet, Take 1 tablet (1,000 mg total) by mouth daily., Disp: 90 tablet, Rfl: 4  Observations/Objective: Patient is well-developed, well-nourished in no acute distress.  Resting comfortably  at home.  Head is normocephalic, atraumatic.  No labored breathing.  Speech is clear and coherent with logical content.  Patient is alert and oriented at baseline.  Nasal congestion  Intermittent dry cough  Assessment and Plan: 1. Flu-like symptoms - naproxen (NAPROSYN) 500 MG tablet; Take 1 tablet (500 mg total) by mouth 2 (two) times daily with a meal.  Dispense: 30 tablet; Refill: 0 - fluticasone (FLONASE) 50 MCG/ACT nasal spray; Place 2 sprays into both nostrils daily.  Dispense: 16 g; Refill: 6  Rest, force fluids, tylenol as needed, report any worsening symptoms such as increased shortness of breath, swelling, or continued high fevers. Out of window for antivirals. Work note given.    Follow Up Instructions: I discussed the assessment and treatment plan with the patient. The patient was provided an opportunity to ask questions and all were answered. The patient agreed with the plan and demonstrated an understanding of the instructions.  A  copy of instructions were sent to the patient via MyChart unless otherwise noted below.     The patient was advised to call back or seek an in-person evaluation if the symptoms worsen or if the condition fails to improve as anticipated.  Time:  I spent 8 minutes with the patient via telehealth technology discussing the above problems/concerns.    Jannifer Rodney, FNP

## 2021-06-21 ENCOUNTER — Telehealth: Payer: Commercial Managed Care - PPO | Admitting: Physician Assistant

## 2021-06-21 DIAGNOSIS — R0981 Nasal congestion: Secondary | ICD-10-CM | POA: Diagnosis not present

## 2021-06-21 DIAGNOSIS — R079 Chest pain, unspecified: Secondary | ICD-10-CM

## 2021-06-21 DIAGNOSIS — R6889 Other general symptoms and signs: Secondary | ICD-10-CM

## 2021-06-21 DIAGNOSIS — B54 Unspecified malaria: Secondary | ICD-10-CM

## 2021-06-21 NOTE — Progress Notes (Signed)
Virtual Visit Consent   Rodney Higgins, you are scheduled for a virtual visit with a [facility] provider today.     Just as with appointments in the office, your consent must be obtained to participate.  Your consent will be active for this visit and any virtual visit you may have with one of our providers in the next 365 days.     If you have a MyChart account, a copy of this consent can be sent to you electronically.  All virtual visits are billed to your insurance company just like a traditional visit in the office.    As this is a virtual visit, video technology does not allow for your provider to perform a traditional examination.  This may limit your provider's ability to fully assess your condition.  If your provider identifies any concerns that need to be evaluated in person or the need to arrange testing (such as labs, EKG, etc.), we will make arrangements to do so.     Although advances in technology are sophisticated, we cannot ensure that it will always work on either your end or our end.  If the connection with a video visit is poor, the visit may have to be switched to a telephone visit.  With either a video or telephone visit, we are not always able to ensure that we have a secure connection.     I need to obtain your verbal consent now.   Are you willing to proceed with your visit today?    [name] has provided verbal consent on 06/21/2021 for a virtual visit (video or telephone).   Rodney Higgins, [place]   Date: 06/21/2021 9:15 AM   Virtual Visit via Video Note   I, Rodney Higgins, connected with  [name]  ([id number], 09-05-1975) on 06/21/21 at  9:00 AM EST by a video-enabled telemedicine application and verified that I am speaking with the correct person using two identifiers.  Location: Patient: Virtual Visit Location Patient: Home Provider: Virtual Visit Location Provider: Home Office   I discussed the limitations of evaluation and  management by telemedicine and the availability of in person appointments. The patient expressed understanding and agreed to proceed.    History of Present Illness: Rodney Higgins is a 45 y.o. who identifies as a male who was assigned male at birth, and is being seen today for discussion of extension of work note. Patient currently at home with the flu, scheduled to return tomorrow but is still with intermittent fevers and significant head congestion. Has started the Flonase and Naproxen along with OTC Mucinex multi-symptom since visit yesterday. Aches and chest symptoms are improved but other symptoms still present.   HPI: HPI  Problems:  Patient Active Problem List   Diagnosis Date Noted   Alcohol use 06/08/2021   RUQ abdominal pain 05/15/2021   Chronic right-sided low back pain with right-sided sciatica 10/03/2020   Elevated LFTs 05/15/2020   Restless leg syndrome 05/01/2020   Polyp of sigmoid colon    Osteoarthritis of left knee 11/16/2019   Family history of prostate cancer 11/16/2019   Family history of colon cancer 11/16/2019   Raynaud phenomenon 10/19/2019   Nicotine dependence, cigarettes, uncomplicated 10/19/2019   Heart murmur 10/19/2019    Allergies:  Allergies  Allergen Reactions   Penicillins Hives   Medications:  Current Outpatient Medications:    amLODipine (NORVASC) 5 MG tablet, TAKE 1 TABLET BY MOUTH EVERY DAY, Disp: 90 tablet, Rfl: 0  cetirizine (ZYRTEC) 10 MG tablet, Take 10 mg by mouth daily., Disp: , Rfl:    cyclobenzaprine (FLEXERIL) 10 MG tablet, Take 1 tablet (10 mg total) by mouth 3 (three) times daily as needed for muscle spasms., Disp: 90 tablet, Rfl: 1   dicyclomine (BENTYL) 10 MG capsule, Take 1 capsule (10 mg total) by mouth 4 (four) times daily -  before meals and at bedtime., Disp: 90 capsule, Rfl: 2   fluticasone (FLONASE) 50 MCG/ACT nasal spray, Place 2 sprays into both nostrils daily., Disp: 16 g, Rfl: 6   gabapentin (NEURONTIN) 300 MG capsule,  Take 2 capsules (600 mg total) by mouth at bedtime., Disp: 180 capsule, Rfl: 4   ibuprofen (ADVIL) 800 MG tablet, Take 1 tablet (800 mg total) by mouth every 8 (eight) hours as needed., Disp: 30 tablet, Rfl: 0   naproxen (NAPROSYN) 500 MG tablet, Take 1 tablet (500 mg total) by mouth 2 (two) times daily with a meal., Disp: 30 tablet, Rfl: 0   omeprazole (PRILOSEC) 20 MG capsule, Take 1 capsule (20 mg total) by mouth daily., Disp: 30 capsule, Rfl: 3   ondansetron (ZOFRAN ODT) 4 MG disintegrating tablet, Take 1 tablet (4 mg total) by mouth every 8 (eight) hours as needed for nausea or vomiting., Disp: 20 tablet, Rfl: 0   valACYclovir (VALTREX) 1000 MG tablet, Take 1 tablet (1,000 mg total) by mouth daily., Disp: 90 tablet, Rfl: 4  Observations/Objective: Patient is well-developed, well-nourished in no acute distress.  Resting comfortably at home.  Head is normocephalic, atraumatic.  No labored breathing. Speech is clear and coherent with logical content.  Patient is alert and oriented at baseline.   Assessment and Plan: 1. Flu-like symptoms  Continue care discussed at yesterday's visit. Will extend work note for additional day to give more time for him to be 24 hours without fever before returning.   Follow Up Instructions: I discussed the assessment and treatment plan with the patient. The patient was provided an opportunity to ask questions and all were answered. The patient agreed with the plan and demonstrated an understanding of the instructions.  A copy of instructions were sent to the patient via MyChart unless otherwise noted below.   The patient was advised to call back or seek an in-person evaluation if the symptoms worsen or if the condition fails to improve as anticipated.  Time:  I spent 12 minutes with the patient via telehealth technology discussing the above problems/concerns.    Rodney Climes, PA-C

## 2021-06-21 NOTE — Patient Instructions (Signed)
°  Katrine Coho, thank you for joining [name], PA-C for today's virtual visit.  While this provider is not your primary care provider (PCP), if your PCP is located in our provider database this encounter information will be shared with them immediately following your visit.  Consent: (Patient) Katrine Coho provided verbal consent for this virtual visit at the beginning of the encounter.  Current Medications:  Current Outpatient Medications:    amLODipine (NORVASC) 5 MG tablet, TAKE 1 TABLET BY MOUTH EVERY DAY, Disp: 90 tablet, Rfl: 0   cetirizine (ZYRTEC) 10 MG tablet, Take 10 mg by mouth daily., Disp: , Rfl:    cyclobenzaprine (FLEXERIL) 10 MG tablet, Take 1 tablet (10 mg total) by mouth 3 (three) times daily as needed for muscle spasms., Disp: 90 tablet, Rfl: 1   dicyclomine (BENTYL) 10 MG capsule, Take 1 capsule (10 mg total) by mouth 4 (four) times daily -  before meals and at bedtime., Disp: 90 capsule, Rfl: 2   fluticasone (FLONASE) 50 MCG/ACT nasal spray, Place 2 sprays into both nostrils daily., Disp: 16 g, Rfl: 6   gabapentin (NEURONTIN) 300 MG capsule, Take 2 capsules (600 mg total) by mouth at bedtime., Disp: 180 capsule, Rfl: 4   ibuprofen (ADVIL) 800 MG tablet, Take 1 tablet (800 mg total) by mouth every 8 (eight) hours as needed., Disp: 30 tablet, Rfl: 0   naproxen (NAPROSYN) 500 MG tablet, Take 1 tablet (500 mg total) by mouth 2 (two) times daily with a meal., Disp: 30 tablet, Rfl: 0   omeprazole (PRILOSEC) 20 MG capsule, Take 1 capsule (20 mg total) by mouth daily., Disp: 30 capsule, Rfl: 3   ondansetron (ZOFRAN ODT) 4 MG disintegrating tablet, Take 1 tablet (4 mg total) by mouth every 8 (eight) hours as needed for nausea or vomiting., Disp: 20 tablet, Rfl: 0   valACYclovir (VALTREX) 1000 MG tablet, Take 1 tablet (1,000 mg total) by mouth daily., Disp: 90 tablet, Rfl: 4   Medications ordered in this encounter:  No orders of the defined types were placed in this  encounter.    *If you need refills on other medications prior to your next appointment, please contact your pharmacy*  Follow-Up: Call back or seek an in-person evaluation if the symptoms worsen or if the condition fails to improve as anticipated.  Other Instruction   If you have been instructed to have an in-person evaluation today at a local Urgent Care facility, please use the link below. It will take you to a list of all of our available [facility], including address, phone number and hours of operation. Please do not delay care.  [facility]  If you or a family member do not have a primary care provider, use the link below to schedule a visit and establish care. When you choose a [facility] primary care physician or advanced practice provider, you gain a long-term partner in health. Find a Primary Care Provider  Learn more about [facility] in-office and virtual care options:  - Get Care Now

## 2021-07-03 ENCOUNTER — Encounter: Payer: Self-pay | Admitting: Nurse Practitioner

## 2021-07-03 ENCOUNTER — Telehealth (INDEPENDENT_AMBULATORY_CARE_PROVIDER_SITE_OTHER): Payer: Commercial Managed Care - PPO | Admitting: Nurse Practitioner

## 2021-07-03 DIAGNOSIS — R059 Cough, unspecified: Secondary | ICD-10-CM | POA: Diagnosis not present

## 2021-07-03 DIAGNOSIS — R6889 Other general symptoms and signs: Secondary | ICD-10-CM | POA: Diagnosis not present

## 2021-07-03 DIAGNOSIS — R0981 Nasal congestion: Secondary | ICD-10-CM | POA: Diagnosis not present

## 2021-07-03 NOTE — Assessment & Plan Note (Signed)
Overall improved at this time.  Will fill out FMLA papers for patient for 06/19/21 to 06/22/21 once papers available.  Return as needed.

## 2021-07-03 NOTE — Progress Notes (Signed)
There were no vitals taken for this visit.   Subjective:    Patient ID: Rodney Higgins, male    DOB: 12/25/1975, 46 y.o.   MRN: [id number]  HPI: Rodney Higgins is a 46 y.o. male  Chief Complaint  Patient presents with   URI    Pt states he is doing better, states he was diagnosed with the flu 06/20/21. States he was taken out of work until from 06/19/21 through 06/22/21. States he needs FMLA filled out for this.    This visit was completed via video visit through MyChart due to the restrictions of the COVID-19 pandemic. All issues as above were discussed and addressed. Physical exam was done as above through visual confirmation on video through MyChart. If it was felt that the patient should be evaluated in the office, they were directed there. The patient verbally consented to this visit. Location of the patient: home Location of the provider: work Those involved with this call:  Provider: Marnee Guarneri, DNP CMA: Yvonna Alanis, [place] Desk/Registration: [employer]  Time spent on call:  21 minutes with patient face to face via video conference. More than 50% of this time was spent in counseling and coordination of care. 15 minutes total spent in review of patient's record and preparation of their chart.  I verified patient identity using two factors (patient name and date of birth). Patient consents verbally to being seen via telemedicine visit today.    UPPER RESPIRATORY TRACT INFECTION Was diagnosed with flu via video visit on 06/20/21 -- was allowed to be off work from 06/19/21 to 06/22/21.  Notes reviewed.  No flu testing was done as was video visit only.  Treated for symptoms only.  Reports he is overall feeling better with exception a little bit of cough.  FMLA papers needed. Fever: no Cough: yes Shortness of breath: no Wheezing: no Chest pain: no Chest tightness: no Chest congestion: no Nasal congestion: no Runny nose: yes Post nasal drip: yes Sneezing:  no Sore throat: no Swollen glands: no Sinus pressure:  mild Headache: no Face pain: no Toothache: no Ear pain: none Ear pressure: none Eyes red/itching:no Eye drainage/crusting: no  Vomiting: no Rash: no Fatigue: yes Sick contacts: no Strep contacts: no  Context: better Recurrent sinusitis: no Relief with OTC cold/cough medications: yes  Treatments attempted: Aleeve and Flonase   Relevant past medical, surgical, family and social history reviewed and updated as indicated. Interim medical history since our last visit reviewed. Allergies and medications reviewed and updated.  Review of Systems  Constitutional:  Negative for activity change, appetite change, diaphoresis, fatigue, fever and unexpected weight change.  HENT:  Positive for postnasal drip, rhinorrhea and sinus pressure. Negative for congestion, sinus pain, sore throat and voice change.   Respiratory:  Positive for cough. Negative for chest tightness, shortness of breath and wheezing.   Cardiovascular:  Negative for chest pain, palpitations and leg swelling.  Gastrointestinal: Negative.   Neurological: Negative.   Psychiatric/Behavioral: Negative.     Per HPI unless specifically indicated above     Objective:    There were no vitals taken for this visit.  Wt Readings from Last 3 Encounters:  06/08/21 169 lb 6.4 oz (76.8 kg)  05/22/21 171 lb 6.4 oz (77.7 kg)  05/15/21 161 lb 6.4 oz (73.2 kg)    Physical Exam Vitals and nursing note reviewed.  Constitutional:      General: He is awake. He is not in acute distress.  Appearance: He is well-developed. He is not ill-appearing.  HENT:     Head: Normocephalic.     Right Ear: Hearing normal. No drainage.     Left Ear: Hearing normal. No drainage.  Eyes:     General: Lids are normal.        Right eye: No discharge.        Left eye: No discharge.     Conjunctiva/sclera: Conjunctivae normal.  Pulmonary:     Effort: Pulmonary effort is normal. No accessory  muscle usage or respiratory distress.  Musculoskeletal:     Cervical back: Normal range of motion.  Neurological:     Mental Status: He is alert and oriented to person, place, and time.  Psychiatric:        Mood and Affect: Mood normal.        Behavior: Behavior normal. Behavior is cooperative.        Thought Content: Thought content normal.        Judgment: Judgment normal.   Results for orders placed or performed in visit on 06/08/21  Comprehensive metabolic panel  Result Value Ref Range   Glucose 76 70 - 99 mg/dL   BUN 12 6 - 24 mg/dL   Creatinine, Ser 1.06 0.76 - 1.27 mg/dL   eGFR 88 >59 mL/min/1.73   BUN/Creatinine Ratio 11 9 - 20   Sodium 140 134 - 144 mmol/L   Potassium 4.7 3.5 - 5.2 mmol/L   Chloride 100 96 - 106 mmol/L   CO2 26 20 - 29 mmol/L   Calcium 9.6 8.7 - 10.2 mg/dL   Total Protein 7.5 6.0 - 8.5 g/dL   Albumin 4.7 4.0 - 5.0 g/dL   Globulin, Total 2.8 1.5 - 4.5 g/dL   Albumin/Globulin Ratio 1.7 1.2 - 2.2   Bilirubin Total 0.3 0.0 - 1.2 mg/dL   Alkaline Phosphatase 83 44 - 121 IU/L   AST 46 (H) 0 - 40 IU/L   ALT 57 (H) 0 - 44 IU/L  CBC with Differential/Platelet  Result Value Ref Range   WBC 4.5 3.4 - 10.8 x10E3/uL   RBC 5.17 4.14 - 5.80 x10E6/uL   Hemoglobin 16.6 13.0 - 17.7 g/dL   Hematocrit 48.5 37.5 - 51.0 %   MCV 94 79 - 97 fL   MCH 32.1 26.6 - 33.0 pg   MCHC 34.2 31.5 - 35.7 g/dL   RDW 13.6 11.6 - 15.4 %   Platelets 245 150 - 450 x10E3/uL   Neutrophils 51 Not Estab. %   Lymphs 36 Not Estab. %   Monocytes 9 Not Estab. %   Eos 2 Not Estab. %   Basos 2 Not Estab. %   Neutrophils Absolute 2.3 1.4 - 7.0 x10E3/uL   Lymphocytes Absolute 1.6 0.7 - 3.1 x10E3/uL   Monocytes Absolute 0.4 0.1 - 0.9 x10E3/uL   EOS (ABSOLUTE) 0.1 0.0 - 0.4 x10E3/uL   Basophils Absolute 0.1 0.0 - 0.2 x10E3/uL   Immature Granulocytes 0 Not Estab. %   Immature Grans (Abs) 0.0 0.0 - 0.1 x10E3/uL  HIV Antibody (routine testing w rflx)  Result Value Ref Range   HIV Screen 4th  Generation wRfx Non Reactive Non Reactive  Hepatitis C antibody  Result Value Ref Range   Hep C Virus Ab <0.1 0.0 - 0.9 s/co ratio      Assessment & Plan:   Problem List Items Addressed This Visit       Other   Flu-like symptoms    Overall improved at  this time.  Will fill out FMLA papers for patient for 06/19/21 to 06/22/21 once papers available.  Return as needed.       I discussed the assessment and treatment plan with the patient. The patient was provided an opportunity to ask questions and all were answered. The patient agreed with the plan and demonstrated an understanding of the instructions.   The patient was advised to call back or seek an in-person evaluation if the symptoms worsen or if the condition fails to improve as anticipated.   I provided 21+ minutes of time during this encounter.   Follow up plan: Return if symptoms worsen or fail to improve.

## 2021-07-03 NOTE — Patient Instructions (Signed)

## 2021-07-10 ENCOUNTER — Telehealth: Payer: Self-pay | Admitting: Nurse Practitioner

## 2021-07-10 NOTE — Telephone Encounter (Signed)
Left message on pts vm to get clarification on FMLA papers. We don't have any documentation that states we received his paperwork.

## 2021-07-10 NOTE — Telephone Encounter (Signed)
Copied from CRM [id number]. Topic: General - Other >> Jul 09, 2021  4:00 PM [facility], [facility] doctor R wrote: Reason for CRM: pt calling in stating that pcp would fill out fmla paperwork for him, he needs it to ref;ect that  he was out of work on 12/2-12/12 and 12/20-12/23 He needs it faxed over to [phone], pt wants a mychart message when completed

## 2021-07-11 ENCOUNTER — Encounter: Payer: Self-pay | Admitting: Nurse Practitioner

## 2021-07-11 NOTE — Telephone Encounter (Signed)
Patient called back to speak to Jon Gills just to inform that there are no paper work coming he basically need a letter/ note of some type from PCP on clinic letter head outlining dates that he left previously and have that note/ letter faxed to number left also. Need this for his employer ASAP please any questions please call Ph# [phone]

## 2021-07-11 NOTE — Telephone Encounter (Signed)
Routing to provider  

## 2021-07-11 NOTE — Telephone Encounter (Signed)
Patient stopped by the office and was given a copy of recent requested note. Patient states the current letter needs to mention that patient was out of work from 06/01/21 through 06/11/21. Patient was advised that we would like the provider to update his letter and faxed it over to his employer. Please advise?

## 2021-07-11 NOTE — Telephone Encounter (Signed)
2nd attempt- Lmom asking pt to call back to let him know that we have not received any FMLA papers.

## 2021-07-18 NOTE — Telephone Encounter (Signed)
Letter has been faxed.

## 2021-07-18 NOTE — Telephone Encounter (Signed)
Encounter was closed and I was not aware new letter was completed. Will print and fax over for patient to provided contact information.

## 2021-07-18 NOTE — Telephone Encounter (Signed)
Pt called an needs new printed letter sent by fax to [employer] asap for disability / letter final due date is today / please fax to [phone]/ please advise when faxed

## 2021-08-03 NOTE — Telephone Encounter (Signed)
[facility] needs the [facility] notes from his last visit on 12.22.22 with Dr. Daphine Deutscher and the notes from visit with Jolene from 12.9.22 and  1.3.23/ please advise / they have denied his claim until they receive these notes/ please send asap to fax# [phone]/ please advise pt when this has been done

## 2021-08-03 NOTE — Telephone Encounter (Signed)
Faxed information to [facility] and left a vm to make pt aware of this.

## 2021-08-23 ENCOUNTER — Encounter: Payer: Self-pay | Admitting: *Deleted

## 2021-08-24 ENCOUNTER — Encounter: Payer: Self-pay | Admitting: Certified Registered Nurse Anesthetist

## 2021-08-24 ENCOUNTER — Ambulatory Visit: Admit: 2021-08-24 | Payer: Commercial Managed Care - PPO

## 2021-08-24 ENCOUNTER — Encounter: Admission: RE | Payer: Self-pay | Source: Home / Self Care

## 2021-08-24 ENCOUNTER — Ambulatory Visit: Admission: RE | Admit: 2021-08-24 | Payer: Commercial Managed Care - PPO | Source: Home / Self Care

## 2021-08-24 SURGERY — ESOPHAGOGASTRODUODENOSCOPY (EGD) WITH PROPOFOL
Anesthesia: General

## 2021-08-24 SURGERY — EGD (ESOPHAGOGASTRODUODENOSCOPY)
Anesthesia: General

## 2021-11-04 DIAGNOSIS — R911 Solitary pulmonary nodule: Secondary | ICD-10-CM | POA: Insufficient documentation

## 2021-11-04 NOTE — Patient Instructions (Incomplete)
Healthy Eating ?Following a healthy eating pattern may help you to achieve and maintain a healthy body weight, reduce the risk of chronic disease, and live a long and productive life. It is important to follow a healthy eating pattern at an appropriate calorie level for your body. Your nutritional needs should be met primarily through food by choosing a variety of nutrient-rich foods. ?What are tips for following this plan? ?Reading food labels ?Read labels and choose the following: ?Reduced or low sodium. ?Juices with 100% fruit juice. ?Foods with low saturated fats and high polyunsaturated and monounsaturated fats. ?Foods with whole grains, such as whole wheat, cracked wheat, Chenard rice, and wild rice. ?Whole grains that are fortified with folic acid. This is recommended for women who are pregnant or who want to become pregnant. ?Read labels and avoid the following: ?Foods with a lot of added sugars. These include foods that contain Jenkin sugar, corn sweetener, corn syrup, dextrose, fructose, glucose, high-fructose corn syrup, honey, invert sugar, lactose, malt syrup, maltose, molasses, raw sugar, sucrose, trehalose, or turbinado sugar. ?Do not eat more than the following amounts of added sugar per day: ?6 teaspoons (25 g) for women. ?9 teaspoons (38 g) for men. ?Foods that contain processed or refined starches and grains. ?Refined grain products, such as white flour, degermed cornmeal, white bread, and white rice. ?Shopping ?Choose nutrient-rich snacks, such as vegetables, whole fruits, and nuts. Avoid high-calorie and high-sugar snacks, such as potato chips, fruit snacks, and candy. ?Use oil-based dressings and spreads on foods instead of solid fats such as butter, stick margarine, or cream cheese. ?Limit pre-made sauces, mixes, and "instant" products such as flavored rice, instant noodles, and ready-made pasta. ?Try more plant-protein sources, such as tofu, tempeh, black beans, edamame, lentils, nuts, and  seeds. ?Explore eating plans such as the Mediterranean diet or vegetarian diet. ?Cooking ?Use oil to saut? or stir-fry foods instead of solid fats such as butter, stick margarine, or lard. ?Try baking, boiling, grilling, or broiling instead of frying. ?Remove the fatty part of meats before cooking. ?Steam vegetables in water or broth. ?Meal planning ? ?At meals, imagine dividing your plate into fourths: ?One-half of your plate is fruits and vegetables. ?One-fourth of your plate is whole grains. ?One-fourth of your plate is protein, especially lean meats, poultry, eggs, tofu, beans, or nuts. ?Include low-fat dairy as part of your daily diet. ?Lifestyle ?Choose healthy options in all settings, including home, work, school, restaurants, or stores. ?Prepare your food safely: ?Wash your hands after handling raw meats. ?Keep food preparation surfaces clean by regularly washing with hot, soapy water. ?Keep raw meats separate from ready-to-eat foods, such as fruits and vegetables. ?Cook seafood, meat, poultry, and eggs to the recommended internal temperature. ?Store foods at safe temperatures. In general: ?Keep cold foods at 40?F (4.4?C) or below. ?Keep hot foods at 140?F (60?C) or above. ?Keep your freezer at 0?F (-17.8?C) or below. ?Foods are no longer safe to eat when they have been between the temperatures of 40?-140?F (4.4-60?C) for more than 2 hours. ?What foods should I eat? ?Fruits ?Aim to eat 2 cup-equivalents of fresh, canned (in natural juice), or frozen fruits each day. Examples of 1 cup-equivalent of fruit include 1 small apple, 8 large strawberries, 1 cup canned fruit, ? cup dried fruit, or 1 cup 100% juice. ?Vegetables ?Aim to eat 2?-3 cup-equivalents of fresh and frozen vegetables each day, including different varieties and colors. Examples of 1 cup-equivalent of vegetables include 2 medium carrots, 2 cups raw,  leafy greens, 1 cup chopped vegetable (raw or cooked), or 1 medium baked potato. ?Grains ?Aim to  eat 6 ounce-equivalents of whole grains each day. Examples of 1 ounce-equivalent of grains include 1 slice of bread, 1 cup ready-to-eat cereal, 3 cups popcorn, or ? cup cooked rice, pasta, or cereal. ?Meats and other proteins ?Aim to eat 5-6 ounce-equivalents of protein each day. Examples of 1 ounce-equivalent of protein include 1 egg, 1/2 cup nuts or seeds, or 1 tablespoon (16 g) peanut butter. A cut of meat or fish that is the size of a deck of cards is about 3-4 ounce-equivalents. ?Of the protein you eat each week, try to have at least 8 ounces come from seafood. This includes salmon, trout, herring, and anchovies. ?Dairy ?Aim to eat 3 cup-equivalents of fat-free or low-fat dairy each day. Examples of 1 cup-equivalent of dairy include 1 cup (240 mL) milk, 8 ounces (250 g) yogurt, 1? ounces (44 g) natural cheese, or 1 cup (240 mL) fortified soy milk. ?Fats and oils ?Aim for about 5 teaspoons (21 g) per day. Choose monounsaturated fats, such as canola and olive oils, avocados, peanut butter, and most nuts, or polyunsaturated fats, such as sunflower, corn, and soybean oils, walnuts, pine nuts, sesame seeds, sunflower seeds, and flaxseed. ?Beverages ?Aim for six 8-oz glasses of water per day. Limit coffee to three to five 8-oz cups per day. ?Limit caffeinated beverages that have added calories, such as soda and energy drinks. ?Limit alcohol intake to no more than 1 drink a day for nonpregnant women and 2 drinks a day for men. One drink equals 12 oz of beer (355 mL), 5 oz of wine (148 mL), or 1? oz of hard liquor (44 mL). ?Seasoning and other foods ?Avoid adding excess amounts of salt to your foods. Try flavoring foods with herbs and spices instead of salt. ?Avoid adding sugar to foods. ?Try using oil-based dressings, sauces, and spreads instead of solid fats. ?This information is based on general [place] nutrition guidelines. For more information, visit choosemyplate.gov. Exact amounts may vary based on your nutrition  needs. ?Summary ?A healthy eating plan may help you to maintain a healthy weight, reduce the risk of chronic diseases, and stay active throughout your life. ?Plan your meals. Make sure you eat the right portions of a variety of nutrient-rich foods. ?Try baking, boiling, grilling, or broiling instead of frying. ?Choose healthy options in all settings, including home, work, school, restaurants, or stores. ?This information is not intended to replace advice given to you by your health care provider. Make sure you discuss any questions you have with your health care provider. ?Document Revised: 02/13/2021 Document Reviewed: 02/13/2021 ?Elsevier Patient Education ? 2023 [employer] ? ?

## 2021-11-06 ENCOUNTER — Encounter: Payer: Commercial Managed Care - PPO | Admitting: Nurse Practitioner

## 2021-12-25 ENCOUNTER — Encounter: Payer: Commercial Managed Care - PPO | Admitting: Nurse Practitioner

## 2021-12-25 DIAGNOSIS — G2581 Restless legs syndrome: Secondary | ICD-10-CM

## 2021-12-25 DIAGNOSIS — Z8042 Family history of malignant neoplasm of prostate: Secondary | ICD-10-CM

## 2021-12-25 DIAGNOSIS — I7300 Raynaud's syndrome without gangrene: Secondary | ICD-10-CM

## 2021-12-25 DIAGNOSIS — R011 Cardiac murmur, unspecified: Secondary | ICD-10-CM

## 2021-12-25 DIAGNOSIS — Z789 Other specified health status: Secondary | ICD-10-CM

## 2021-12-25 DIAGNOSIS — Z136 Encounter for screening for cardiovascular disorders: Secondary | ICD-10-CM

## 2021-12-25 DIAGNOSIS — R7989 Other specified abnormal findings of blood chemistry: Secondary | ICD-10-CM

## 2021-12-25 DIAGNOSIS — Z0000 Encounter for general adult medical examination without abnormal findings: Secondary | ICD-10-CM

## 2022-05-29 ENCOUNTER — Encounter: Payer: Self-pay | Admitting: Nurse Practitioner

## 2022-05-29 ENCOUNTER — Other Ambulatory Visit: Payer: Self-pay | Admitting: Nurse Practitioner

## 2022-05-29 MED ORDER — VALACYCLOVIR HCL 1 G PO TABS: 1000.0000 mg | ORAL_TABLET | Freq: Every day | ORAL | 4 refills | Status: AC

## 2022-07-23 ENCOUNTER — Encounter: Payer: Self-pay | Admitting: Nurse Practitioner

## 2023-06-07 IMAGING — CT CT ABD-PELV W/O CM
1 of 2 series · 15 of 32 positions shown, 19 images · non-contrast
Comparison: None.

CLINICAL DATA: Weight loss. Right upper quadrant pain. Abdominal
cramping and nausea.

EXAM:
CT ABDOMEN AND PELVIS WITHOUT CONTRAST
TECHNIQUE: Multidetector CT imaging of the abdomen and pelvis was performed
following the standard protocol without IV contrast.

[Series 2: axial st · axial · 0.75mm/px · z∈[-1164,-724]mm · 15 of 97 slices shown, 19 images]
[im 5/97  soft-tissue]
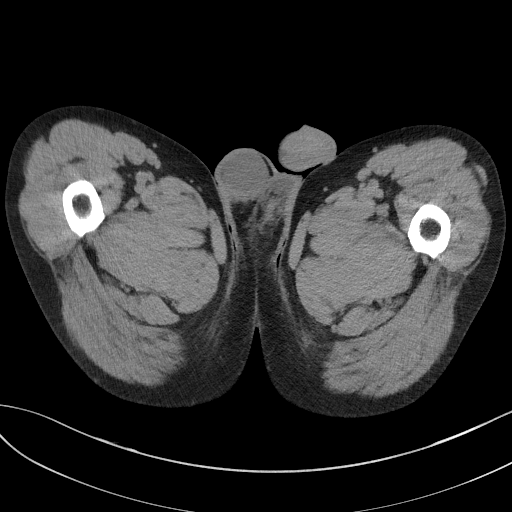
[im 5/97  bone]
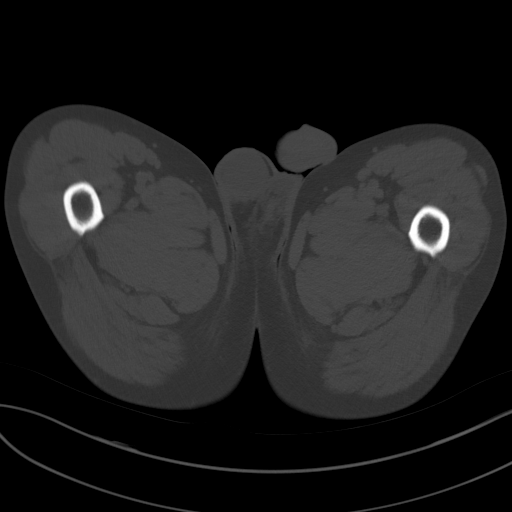
[im 13/97  soft-tissue]
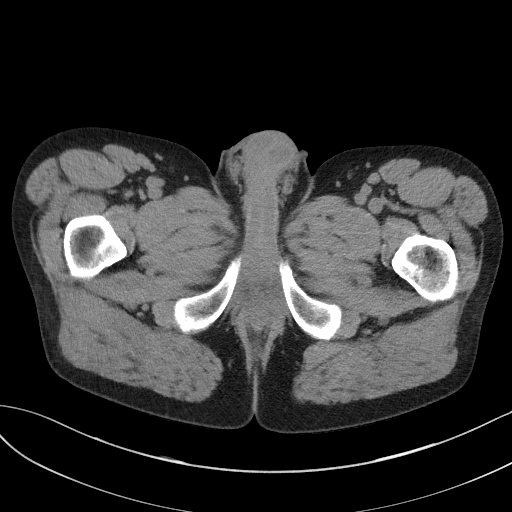
[im 21/97  soft-tissue]
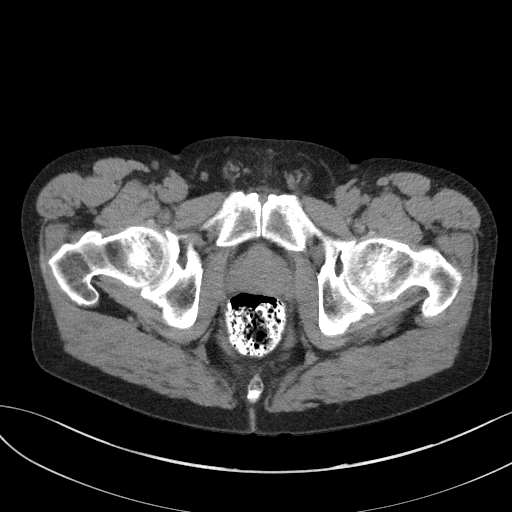
[im 29/97  soft-tissue]
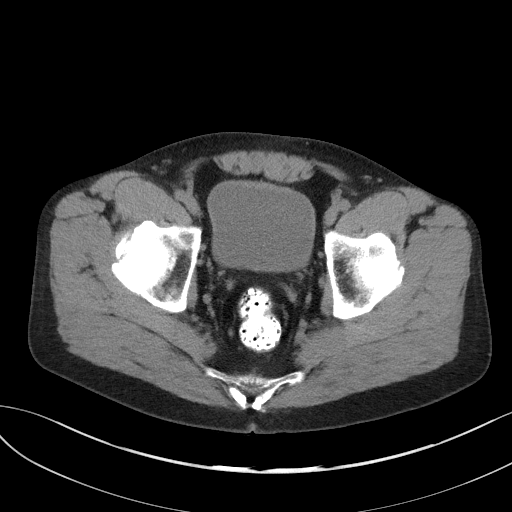
[im 33/97  soft-tissue]
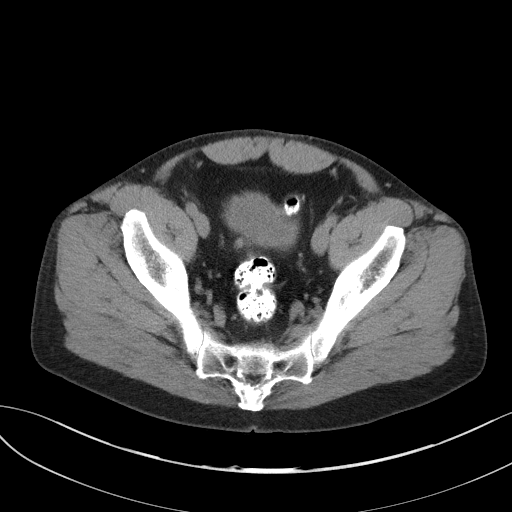
[im 41/97  soft-tissue]
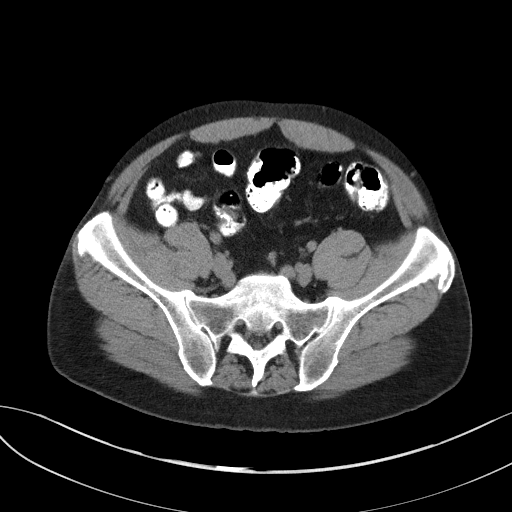
[im 49/97  soft-tissue]
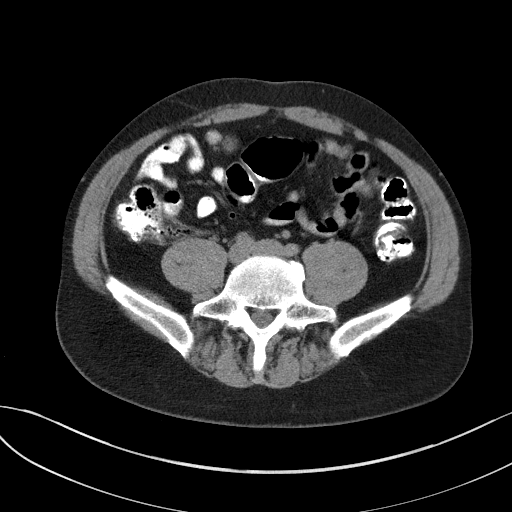
[im 57/97  soft-tissue]
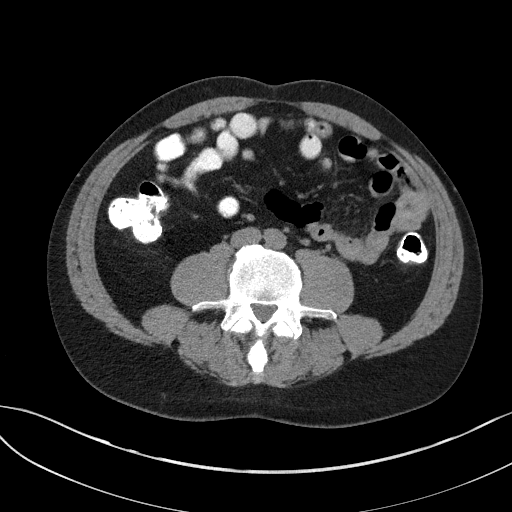
[im 65/97  soft-tissue]
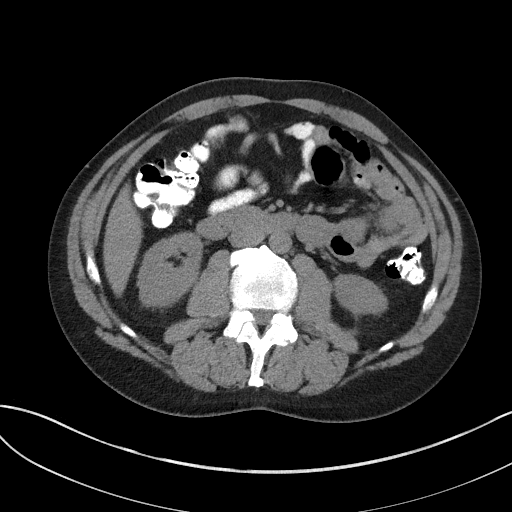
[im 65/97  bone]
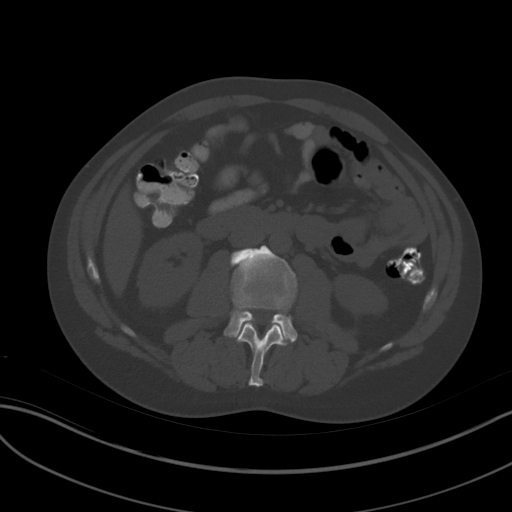
[im 69/97  soft-tissue]
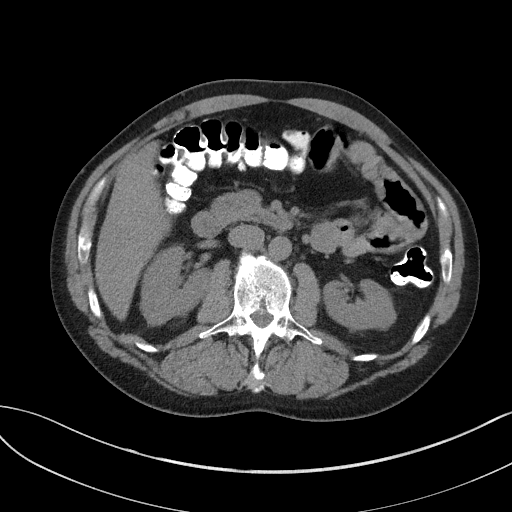
[im 77/97  soft-tissue]
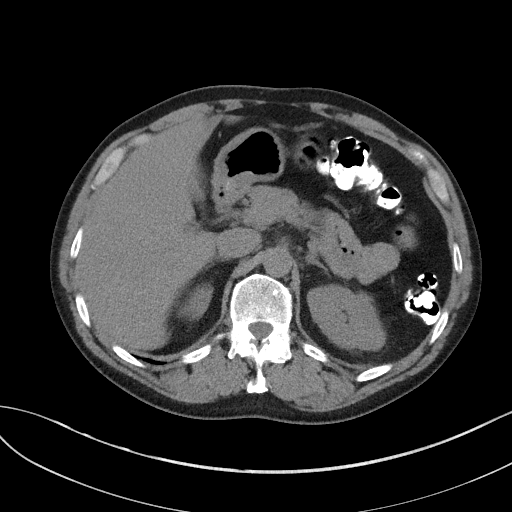
[im 81/97  lung]
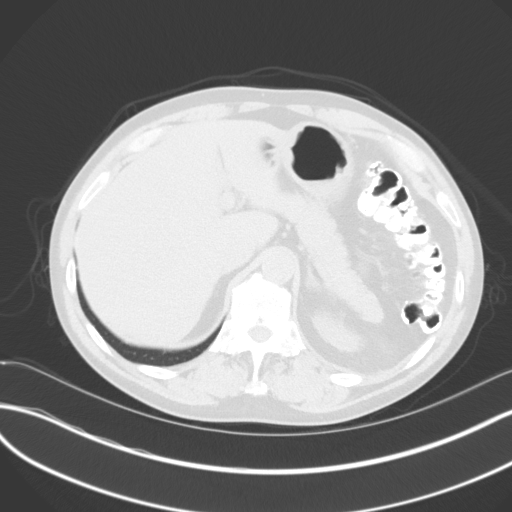
[im 85/97  soft-tissue]
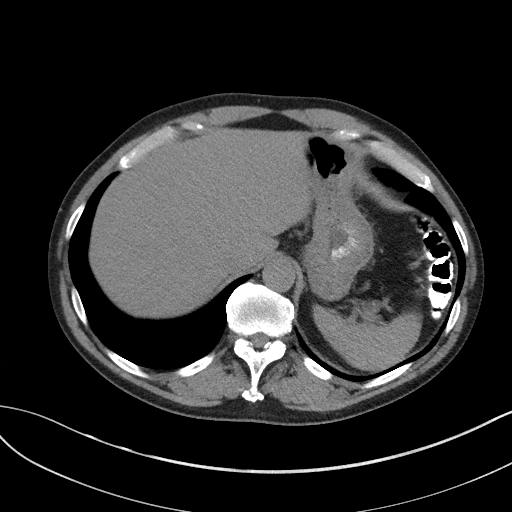
[im 85/97  lung]
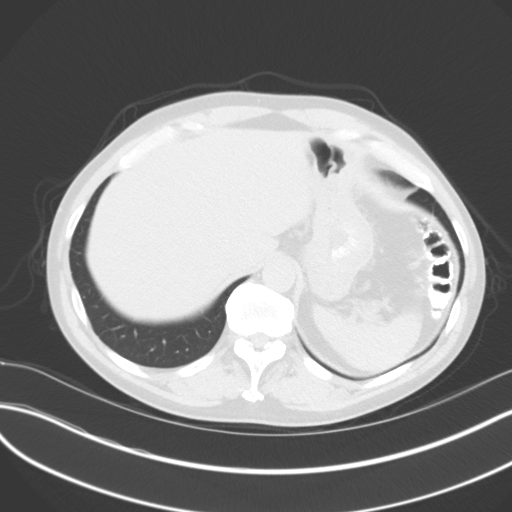
[im 89/97  lung]
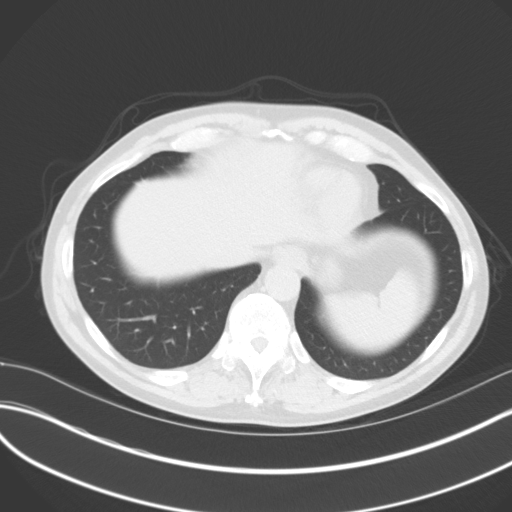
[im 93/97  soft-tissue]
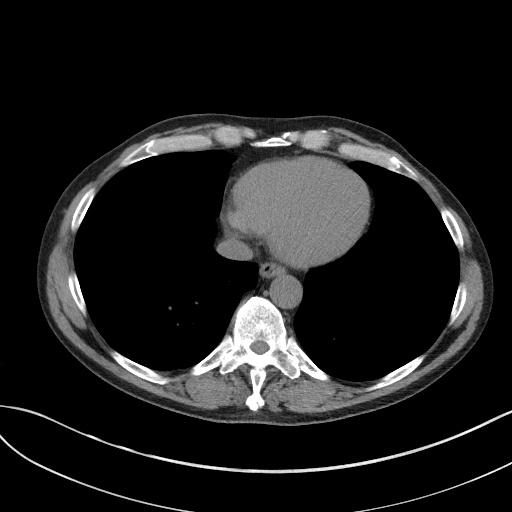
[im 93/97  lung]
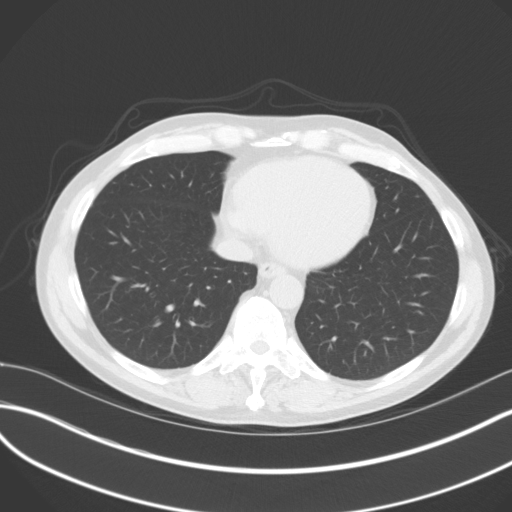

[15 of 32 positions shown; findings below may reference images not displayed]

FINDINGS: Lower chest: Unremarkable.

Hepatobiliary: No focal abnormality in the liver on this study
without intravenous contrast. There is no evidence for gallstones,
gallbladder wall thickening, or pericholecystic fluid. No
intrahepatic or extrahepatic biliary dilation.

Pancreas: No focal mass lesion. No dilatation of the main duct. No
intraparenchymal cyst. No peripancreatic edema.

Spleen: No splenomegaly. No focal mass lesion.

Adrenals/Urinary Tract: No adrenal nodule or mass. Right kidney
unremarkable. 13 mm low-density lesion interpolar left kidney
approaches water attenuation, likely a cyst. No evidence for
hydroureter. The urinary bladder appears normal for the degree of
distention.

Stomach/Bowel: Stomach is unremarkable. No gastric wall thickening.
No evidence of outlet obstruction. Duodenum is normally positioned
as is the ligament of Treitz. No small bowel wall thickening. No
small bowel dilatation. The terminal ileum is normal. The appendix
is normal. No gross colonic mass. No colonic wall thickening.

Vascular/Lymphatic: No abdominal aortic aneurysm. There is no
gastrohepatic or hepatoduodenal ligament lymphadenopathy. No
retroperitoneal or mesenteric lymphadenopathy. No pelvic sidewall
lymphadenopathy.

Reproductive: The prostate gland and seminal vesicles are
unremarkable.

Other: No intraperitoneal free fluid.

Musculoskeletal: No worrisome lytic or sclerotic osseous
abnormality.
IMPRESSION: 1. No acute findings in the abdomen or pelvis. Specifically, no
findings to explain the patient's history of right upper quadrant
pain and weight loss.
2. 13 mm low-density lesion interpolar left kidney approaches water
attenuation, likely a cyst.
